# Patient Record
Sex: Male | Born: 1960 | Race: White | Hispanic: No | Smoking: Never smoker
Health system: Southern US, Community
[De-identification: ages and names within clinical notes are randomized; demographics above are authoritative.]

## PROBLEM LIST (undated history)

## (undated) DIAGNOSIS — N200 Calculus of kidney: Secondary | ICD-10-CM

## (undated) DIAGNOSIS — E663 Overweight: Secondary | ICD-10-CM

## (undated) DIAGNOSIS — M5030 Other cervical disc degeneration, unspecified cervical region: Secondary | ICD-10-CM

## (undated) DIAGNOSIS — G4730 Sleep apnea, unspecified: Secondary | ICD-10-CM

## (undated) DIAGNOSIS — E785 Hyperlipidemia, unspecified: Secondary | ICD-10-CM

## (undated) DIAGNOSIS — F419 Anxiety disorder, unspecified: Secondary | ICD-10-CM

## (undated) DIAGNOSIS — I4891 Unspecified atrial fibrillation: Secondary | ICD-10-CM

## (undated) DIAGNOSIS — M5126 Other intervertebral disc displacement, lumbar region: Secondary | ICD-10-CM

## (undated) DIAGNOSIS — J189 Pneumonia, unspecified organism: Secondary | ICD-10-CM

## (undated) DIAGNOSIS — E119 Type 2 diabetes mellitus without complications: Secondary | ICD-10-CM

## (undated) DIAGNOSIS — G4733 Obstructive sleep apnea (adult) (pediatric): Secondary | ICD-10-CM

## (undated) HISTORY — DX: Sleep apnea, unspecified: G47.30

## (undated) HISTORY — DX: Other cervical disc degeneration, unspecified cervical region: M50.30

## (undated) HISTORY — DX: Overweight: E66.3

## (undated) HISTORY — DX: Hyperlipidemia, unspecified: E78.5

## (undated) HISTORY — PX: MANDIBLE FRACTURE SURGERY: SHX706

## (undated) HISTORY — DX: Type 2 diabetes mellitus without complications: E11.9

## (undated) HISTORY — PX: SHOULDER ARTHROSCOPY WITH OPEN ROTATOR CUFF REPAIR: SHX6092

## (undated) HISTORY — PX: SHOULDER SURGERY: SHX246

## (undated) HISTORY — DX: Obstructive sleep apnea (adult) (pediatric): G47.33

## (undated) HISTORY — PX: ANKLE SURGERY: SHX546

## (undated) HISTORY — PX: WRIST SURGERY: SHX841

---

## 2002-01-08 ENCOUNTER — Inpatient Hospital Stay (HOSPITAL_COMMUNITY): Admission: EM | Admit: 2002-01-08 | Discharge: 2002-01-09 | Payer: Self-pay | Admitting: Orthopedic Surgery

## 2002-04-03 ENCOUNTER — Inpatient Hospital Stay (HOSPITAL_COMMUNITY): Admission: RE | Admit: 2002-04-03 | Discharge: 2002-04-04 | Payer: Self-pay | Admitting: Orthopedic Surgery

## 2002-07-17 ENCOUNTER — Encounter: Payer: Self-pay | Admitting: Family Medicine

## 2002-07-17 ENCOUNTER — Encounter: Admission: RE | Admit: 2002-07-17 | Discharge: 2002-07-17 | Payer: Self-pay | Admitting: Family Medicine

## 2002-09-18 ENCOUNTER — Encounter: Payer: Self-pay | Admitting: Emergency Medicine

## 2002-09-18 ENCOUNTER — Emergency Department (HOSPITAL_COMMUNITY): Admission: EM | Admit: 2002-09-18 | Discharge: 2002-09-18 | Payer: Self-pay | Admitting: Emergency Medicine

## 2003-04-12 ENCOUNTER — Inpatient Hospital Stay (HOSPITAL_COMMUNITY): Admission: EM | Admit: 2003-04-12 | Discharge: 2003-04-14 | Payer: Self-pay | Admitting: Emergency Medicine

## 2003-04-12 ENCOUNTER — Encounter: Payer: Self-pay | Admitting: *Deleted

## 2003-04-14 ENCOUNTER — Encounter (INDEPENDENT_AMBULATORY_CARE_PROVIDER_SITE_OTHER): Payer: Self-pay | Admitting: Cardiology

## 2003-10-28 ENCOUNTER — Ambulatory Visit (HOSPITAL_BASED_OUTPATIENT_CLINIC_OR_DEPARTMENT_OTHER): Admission: RE | Admit: 2003-10-28 | Discharge: 2003-10-28 | Payer: Self-pay | Admitting: Orthopedic Surgery

## 2003-10-28 ENCOUNTER — Ambulatory Visit (HOSPITAL_COMMUNITY): Admission: RE | Admit: 2003-10-28 | Discharge: 2003-10-28 | Payer: Self-pay | Admitting: Orthopedic Surgery

## 2005-07-08 ENCOUNTER — Ambulatory Visit (HOSPITAL_BASED_OUTPATIENT_CLINIC_OR_DEPARTMENT_OTHER): Admission: RE | Admit: 2005-07-08 | Discharge: 2005-07-08 | Payer: Self-pay | Admitting: Family Medicine

## 2005-07-17 ENCOUNTER — Ambulatory Visit: Payer: Self-pay | Admitting: Internal Medicine

## 2006-04-20 ENCOUNTER — Encounter: Admission: RE | Admit: 2006-04-20 | Discharge: 2006-04-20 | Payer: Self-pay | Admitting: Family Medicine

## 2010-07-29 ENCOUNTER — Emergency Department (HOSPITAL_COMMUNITY)
Admission: EM | Admit: 2010-07-29 | Discharge: 2010-07-30 | Payer: Self-pay | Source: Home / Self Care | Admitting: Emergency Medicine

## 2010-07-29 ENCOUNTER — Emergency Department (HOSPITAL_COMMUNITY)
Admission: EM | Admit: 2010-07-29 | Discharge: 2010-07-29 | Payer: Self-pay | Source: Home / Self Care | Admitting: Family Medicine

## 2010-10-11 LAB — POCT I-STAT, CHEM 8
BUN: 17 mg/dL (ref 6–23)
Calcium, Ion: 1.09 mmol/L — ABNORMAL LOW (ref 1.12–1.32)
Chloride: 104 mEq/L (ref 96–112)
Creatinine, Ser: 1.1 mg/dL (ref 0.4–1.5)
Glucose, Bld: 100 mg/dL — ABNORMAL HIGH (ref 70–99)
HCT: 49 % (ref 39.0–52.0)
Hemoglobin: 16.7 g/dL (ref 13.0–17.0)
Potassium: 4.4 meq/L (ref 3.5–5.1)
Sodium: 139 meq/L (ref 135–145)
TCO2: 28 mmol/L (ref 0–100)

## 2010-10-11 LAB — PROTIME-INR
INR: 1.24 (ref 0.00–1.49)
Prothrombin Time: 15.8 s — ABNORMAL HIGH (ref 11.6–15.2)

## 2010-10-11 LAB — APTT: aPTT: 34 s (ref 24–37)

## 2010-12-17 NOTE — Discharge Summary (Signed)
NAME:  Justin Kidd, KUZEL NO.:  [id number]   MEDICAL RECORD NO.:  [id number]                   PATIENT TYPE:  INP   LOCATION:  3710                                 FACILITY:  [facility]   PHYSICIAN:  Meade Maw, M.D.                 DATE OF BIRTH:  06-09-1961   DATE OF ADMISSION:  04/12/2003  DATE OF DISCHARGE:  04/14/2003                                 DISCHARGE SUMMARY   ADMISSION DIAGNOSES:  1. New onset atrial fibrillation.  2. Hyperlipidemia.   DISCHARGE DIAGNOSES:  1. New onset atrial fibrillation with controlled ventricular response.  2. Hyperlipidemia on Tricor.  3. Hypokalemia.   PROCEDURE:  None.   DISCHARGE STATUS:  Stable. Improved.   ADMISSION HISTORY:  Please see complete H&P for details, but in short, this  is a 50 year old male who was admitted April 12, 2003 with new onset  atrial fibrillation that began around midnight the night before admission  while he was eating. He had an awareness of forceful palpitations and rapid  heartbeat. Denied any chest pain, dizziness, or near syncope. Did have mild  shortness of breath. The arrhythmia persisted throughout the night and the  next morning. She presented to the [facility] walk-in clinic where EKG confirmed  atrial fibrillation. He was sent to the emergency room for further treatment  and evaluation.   PHYSICAL EXAMINATION:  Please see complete H&P. EKG showed atrial  fibrillation with controlled ventricular response. No ischemic changes.   ADMISSION LABORATORY DATA:  Showed essentially normal CBC and CMP. Cardiac  enzymes were negative. INR was 1.1.   Chest x-ray showed no acute process.   HOSPITAL COURSE:  The patient was admitted for anticoagulation. Thyroid  function and 2-D echocardiogram will be performed. The patient was started  on IV heparin per pharmacy. He was also started on Coumadin load with  initial dose of 10 mg p.o. For the entire admission, the patient had no  complaints of chest discomfort, shortness of breath, weakness, or dizziness.  He was up ad-lib without complaints. On April 13, 2003 early that  morning, he converted to a sinus rhythm. Heparin was changed to Lovenox. His  IV Cardizem was discontinued, and he was started on Toprol. He was up and  ambulating without difficulty.   He remained in sinus rhythm for the rest of his admission. He was discharged  home on April 14, 2003 without further incident. Two-D echocardiogram  was performed just prior to his discharge. Results showed left atrium of 3.7  cm. He had normal LV size and function with an EF of 65%. There were no wall  motion abnormalities. There was no valvular disease.    DISCHARGE MEDICATIONS:  1. Coumadin 5 mg. He was instructed to take 10 mg daily with the exception     of 5 mg on April 18, 2003.  2. Tricor 160 mg daily.  3. Toprol-XL 50 mg daily.  4. K-Dur 20 mEq daily.   ACTIVITY:  As tolerated. He is maintain a low fat, low cholesterol diet as  well as low caffeine intake.   FOLLOW UP:  He is to come to the [facility] Coumadin clinic on Friday, April 18, 2003 at 3:15 p.m. for INR.   He is to have a stress test at [facility] cardiology Wednesday, April 23, 2003 at 11:30 a.m. He has a followup appointment with Dr. Fraser Din on Monday,  May 05, 2003 at 12:30 p.m. He is to call sooner if problems.      Adrian Saran, N.P.                        Meade Maw, M.D.    HB/MEDQ  D:  05/07/2003  T:  05/08/2003  Job:  [id number]   cc:   Donia Guiles, M.D.  [address] [place]  [place] [address]  Fax: [phone]

## 2010-12-17 NOTE — Op Note (Signed)
[facility]  Patient:    Justin Kidd, Justin Kidd Visit Number: [id number] MRN: [id number]          Service Type: SUR Location: 4W [id number] [id number] Attending Physician:  Marlowe Kays Page Dictated by:   Illene Labrador. Aplington, M.D. Proc. Date: 01/07/02 Admit Date:  01/07/2002 Discharge Date: 01/09/2002                             Operative Report  PREOPERATIVE DIAGNOSIS:  Torn rotator cuff, left shoulder.  POSTOPERATIVE DIAGNOSIS:  Torn rotator cuff, left shoulder.  OPERATION: 1. Anterior acromionectomy. 2. Repair of torn rotator cuff, left shoulder.  SURGEON:  Illene Labrador. Aplington, M.D.  ASSISTANTDruscilla Brownie. Underwood III, P.A.-C.  ANESTHESIA:  General.  PATHOLOGY AND JUSTIFICATION OF PROCEDURE:  He actually has bilateral shoulder pain.  MRI has demonstrated bilateral rotator cuff tears.  The left is more symptomatic than the right and is being repaired at this time.  As depicted in the MRI, he had about a 2 cm with tear at the greater tuberosity with about 1 cm of retraction.  He had a significant impingement problem.  DESCRIPTION OF PROCEDURE:  Prophylactic antibiotics, in the beach chair position position on the Schlein frame.  The left shoulder girdle was prepped with DuraPrep and draped in a sterile field, Ioban employed.  Vertical incision from roughly the [facility] joint down to the greater tuberosity.  The fascia over the anterior acromion was opened in line with the skin incision and the deltoid split minimally.  I had a difficult time getting a Cobb elevator beneath the anterior acromion which was quite thick.  After identifying the [facility] joint with a Keith needle, I marked out my tentative line for anterior acromionectomy with a Bovie and made my initial anterior acromionectomy with a microsaw followed by additional anterior acromionectomy inferiorly until the impingement problem was corrected.  He had a good bit of bursal tissue which I also excised.  The  tear described above was noted.  It was actually a fairly small aperture with a very thin margin of repair of tissue over top, and I opened it up to a full extent of the tear anteriorly and posteriorly. I identified the retraction of the underneath fibers.  I then made a bony trough in the greater tuberosity with quarter inch osteotome and small rongeur and made two lateral drill holes through the greater tuberosity into this bony trough.  I then placed a #1 Ethibond suture with a horizontal mattress suture through the mid portion of the rotator cuff, capturing both the retracted and the superficial layers of rotator cuff and then made two additional horizontal mattress sutures using these holes, one anteriorly and one posteriorly with one loop through the rotator cuff tendon itself.  With the arm abducted, I then tied the three sutures securely and supplemented this with multiple interrupted #1 Ethibond suture through the end of the rotator cuff into rotator cuff tissue which was residual at the greater tuberosity.  This gave a nice, stable repair.  We then checked for residual impingement, and there was none.  The wound was irrigated with sterile saline and soft tissue was infiltrated with 0.5% Marcaine with adrenalin.  He was given 30 mg of Toradol IV.  The deltoid muscle was reapproximated in two layers with interrupted #1 Vicryl and the fascia over the anterior acromion in one layer with interrupted #1 Vicryl to get a nice secure  stable repair.  Subcutaneous tissue was closed with a combination of 2-0 Vicryl deep, 4-0 Vicryl superficially, with Steri-Strips on the skin.  Dry sterile dressing, shoulder immobilizer applied. Tolerated the procedure well and was taken to the recovery room in satisfactory condition with no known complications. Dictated by:   Illene Labrador. Aplington, M.D. Attending Physician:  Joaquin Courts DD:  01/07/02 TD:  01/08/02 Job: [id number]

## 2010-12-17 NOTE — Op Note (Signed)
NAME:  FADIL, MACMASTER                            ACCOUNT NO.:  [id number]   MEDICAL RECORD NO.:  [id number]                   PATIENT TYPE:  AMB   LOCATION:  DSC                                  FACILITY:  [facility]   PHYSICIAN:  Leonides Grills, M.D.                  DATE OF BIRTH:  [birth year]   DATE OF PROCEDURE:  10/28/2003  DATE OF DISCHARGE:                                 OPERATIVE REPORT   PREOPERATIVE DIAGNOSES:  1. Right anterior medial ankle impingement.  2. 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   POSTOPERATIVE DIAGNOSES:  1. Right anterior medial ankle impingement.  2. 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   OPERATION:  1. Right ankle arthroscopy with extensive debridement.  2. Posterior lateral ankle arthrotomy with tenosynovectomy.  3. Partial excision, right talus, i.e., os trigonum excision.  4. Sural nerve neurolysis.   ANESTHESIA:  General endotracheal tube.   SURGEON:  Leonides Grills, M.D.   ASSISTANT:  Lianne Cure, P.A.   ESTIMATED BLOOD LOSS:  Minimal.   TOURNIQUET TIME:  Approximately 1 hour 15 minutes.   COMPLICATIONS:  None.   DISPOSITION:  Stable to the PAR.   INDICATIONS:  This is a 50 year old male who has had persistent ankle pain  that is interfering with his life to the point that he cannot do what he  wants to do.  He was consented for the above procedure.  All risks, which  include infection, nerve or vessel injury, persistence of pain, worsening  pain, stiffness, arthritis, mechanical symptoms, were all explained and  questions encouraged and answered.   OPERATION:  The patient was brought to the operating room and placed in the  supine position after adequate general endotracheal tube anesthesia was  administered as well as Ancef 1 g IV piggyback.  The right lower extremity  was then prepped and draped over a proximally-placed thigh tourniquet after  the patient was placed in a slight sloppy  lateral position with the  operative side up.  A longitudinal incision on the posterolateral aspect of  his ankle was then made.  Dissection was carried down through skin, sural  nerve was identified and neurolysed.  Once the sural nerve neurolysis was  performed, then it was protected throughout the remaining portion of the  case.  Once the sural nerve neurolysis was performed, a posterior ankle  arthrotomy was then made.  There was a large amount of synovitis in this  area, and this was meticulously debrided with a rongeur and a synovectomy  rongeur.  The os trigonum was then palpated.  Then with a curved quarter-  inch osteotome this was removed with a mallet as well.  This was then  rounded off with the rongeur.  This was palpated over medially to the [facility]  tendon as well and visualized.  The ankle was ranged.  In forced plantar  flexion there was no impingement posteriorly.  The area was copiously  irrigated with normal saline.  The subcu was closed with 3-0 Vicryl, the  skin was closed with 4-0 nylon stitch.  Prior to this the limb was gravity-  exsanguinated and the tourniquet was elevated as well.  We then mapped out  the anterior ankle structures, the anterior tibialis and peroneus tertius as  well as the superficial peroneal nerve.  We then placed 20 mL normal saline  to the ankle through anteromedial portal.  The anteromedial portal was  created with __________ spread technique.  A blunt trocar for operative  cannula was placed as well as the camera.  Under direct visualization the  anterior lateral portal was then created with __________ spread technique,  utilizing a spinal needle as well.  There was a large amount of synovitis in  the anterior aspect of his ankle, and there was a tremendously large soft  tissue flap in the anterior medial aspect of the ankle as well.  This was  all debrided extensively with a radiofrequency bevel as well as a shaver.  Once this was extensively  debrided, there was no obvious osteochondral  lesion.  The medial and lateral gutter were also debrided as well.  The  accessory tibial ligament was also hypertrophic and was excised as well.  We  ranged the ankle, and there were no impinging areas, no flaps as well.  The  scope was removed and wounds were closed with 4-0 nylon sutures, sterile  dressing was applied, a Cam walker boot was applied.  The patient was stable  to PAR.                                               Leonides Grills, M.D.    PB/MEDQ  D:  10/28/2003  T:  10/28/2003  Job:  [id number]

## 2010-12-17 NOTE — H&P (Signed)
NAME:  Justin Kidd, Justin Kidd NO.:  [id number]   MEDICAL RECORD NO.:  [id number]                   PATIENT TYPE:  INP   LOCATION:  1825                                 FACILITY:  [facility]   PHYSICIAN:  Cassell Clement, M.D.              DATE OF BIRTH:  [birth year]   DATE OF ADMISSION:  04/12/2003  DATE OF DISCHARGE:                                HISTORY & PHYSICAL   CHIEF COMPLAINT:  Arrhythmia.   HISTORY:  This is a 50 year old, married, Caucasian gentleman who was  admitted with new onset of atrial fibrillation.  The fibrillation began at  midnight last night while he was eating a snack.  He noted sudden onset of  forceful palpitations in his chest and it felt rapid at that time.  However,  he did not experience any chest discomfort.  He had only slight dyspnea.  He  went up and mentioned the arrhythmia to his wife, who is a Engineer, civil (consulting), and she  recommended that he try resting.  He did rest and the arrhythmia persisted  during the night.  This morning he sent to the [facility] where  his electrocardiogram confirmed the presence of atrial fibrillation with a  controlled ventricular response.  There were no ischemic changes.  Because  of the new of atrial fibrillation, the patient was advised to come to the  emergency room for further evaluation.  The patient states that  approximately two years ago he saw Meade Maw, M.D., at [facility]  for workup for arrhythmia.  He recalls having an echocardiogram which he  thinks was normal.  He also had an event monitor for several weeks.  He  recalls being placed on medication, which he thinks was a beta blocker, for  several months.  He also recalls that he was told that he was not having  atrial fibrillation and he did not have to go on Coumadin at that time  therefore.  After several months, he stopped taking the medication and had  no further trouble.  The patient is on no regular medicine, except  for  TriCor for high cholesterol.   FAMILY HISTORY:  The patient's mother and father are alive and well.  The  maternal grandparents have a history of high blood pressure, stroke, and  heart attack.   SOCIAL HISTORY:  He is married.  He has two children, age 23 and age 65  months.  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  The patient does not  use tobacco or alcohol.  He drinks about one-and-a-half glasses of ice tea a  day.  He avoids caffeinated sodas.  He does not drink coffee.  He does not  get any regular exercise.   ALLERGIES:  None.   PAST SURGICAL HISTORY:  He has had surgery on both shoulders for rotator  cuff surgery, which was successful.  His orthopedist  was Dr. Simonne Come.   REVIEW OF SYSTEMS:  GASTROINTESTINAL:  He has had frequent upset stomach  recently and has an appointment to see a gastroenterologist in [place],  [place], soon.  GENITOURINARY:  No symptoms of dysuria.  He has had  kidney stones twice in the past.  He denies any recent cough or sputum  production, but has had pneumonia in the past.  He denies any history of  exertional chest pain or exertional dyspnea.   PHYSICAL EXAMINATION:  VITAL SIGNS:  Blood pressure 113/71, pulse 70 in  atrial fibrillation.  GENERAL APPEARANCE:  A somewhat overweight young man in no acute distress.  SKIN:  Clear.  HEENT:  Pupils are equal and reactive.  The sclerae are clear.  Extraocular  movements are full.  There is no evidence of exophthalmos or proptosis.  NECK:  The carotids are normal.  Jugular venous pressure normal.  Thyroid  normal.  No lymphadenopathy.  LUNGS:  Clear to auscultation and percussion.  HEART:  No murmur, gallop, or click.  ABDOMEN:  Obese, nontender.  No mass.  EXTREMITIES:  Good peripheral pulses.  No edema.  No phlebitis.  NEUROLOGIC:  Physiologic.   LABORATORY DATA:  His electrocardiogram shows atrial fibrillation with a  controlled ventricular response.  There are no ischemic  changes.  Laboratory  studies are pending.  The chest x-ray is not yet done.   IMPRESSION:  1. New onset of atrial fibrillation of undetermined etiology.  The atrial     fibrillation is symptomatic, causing palpitations.  2. History of  hyperlipidemia.   DISPOSITION:  We are admitting the patient to telemetry on Dr. Lindell Spar  service.  Will start him on low-dose IV Cardizem.  His rate is not  excessively fast at the present time.  Will also begin anticoagulation with  heparin and Coumadin.  Will evaluate thyroid function and other correctable  causes of atrial fibrillation.  Will plan to get a 2-D echocardiogram on  Monday when the lab reopens.  Further studies as per Meade Maw, M.D.                                                Cassell Clement, M.D.    TB/MEDQ  D:  04/12/2003  T:  04/12/2003  Job:  [id number]   cc:   Donia Guiles, M.D.  [address] [place]  [place] [address]  Fax: [phone]

## 2010-12-17 NOTE — Op Note (Signed)
Justin Kidd, [name]                           ACCOUNT NO.:  [id number]   MEDICAL RECORD NO.:  [id number]                   PATIENT TYPE:  AMB   LOCATION:  DFTL                                 FACILITY:  [facility]   PHYSICIAN:  James P. Aplington, M.D.            DATE OF BIRTH:  09-Oct-1960   DATE OF PROCEDURE:  04/02/2002  DATE OF DISCHARGE:                                 OPERATIVE REPORT   PREOPERATIVE DIAGNOSIS:  Torn rotator cuff, right shoulder.   POSTOPERATIVE DIAGNOSIS:  Torn rotator cuff, right shoulder.   OPERATION:  Anterior acromionectomy and repair of torn rotator cuff, right  shoulder.   SURGEON:  Illene Labrador. Aplington, M.D.   ASSISTANT:  Clarene Reamer, P.A.-C.   ANESTHESIA:  General.   PATHOLOGY AND JUSTIFICATION FOR PROCEDURE:  He has had successful repair of  a full-thickness rotator cuff tear on the left. He is here today for repair  of what was described on MRI of January 14, 2002 as a small full-thickness  somewhat retracted tear on the right. At surgery, he did have a significant  impingement problem with a very thick subdeltoid bursa. The tear was  described on the MRI at the level of the greater tuberosity.   DESCRIPTION OF PROCEDURE:  Prophylactic antibiotics, satisfactory general  anesthesia. Beach chair position on the Schlein frame, the right shoulder  girdle was prepped with Duraprep and draped in a sterile field, Ioban  employed. A vertical incision from the [facility] joint down to the greater  tuberosity. The fascia over the anterior acromion was opened in line with a  skin incision with cutting cautery and reflected back over the anterior  acromion back to the [facility] joint which I identified with the [facility] needle. I  then marked out my initial line of anterior acromionectomy with Bovie and  protected the underlying rotator cuff structures, made my initial anterior  acromionectomy removal. There I then also removed a large amount of  additional bone from the  subacromial area since he had a significant  impingement problem. I also had to remove a good bit of subdeltoid bursa.  The tear was identified as described above. I opened it with a 15 knife  blade and was able to retrieve the retracted underneath surface fibers of  the rotator cuff and restored the full-thickness rotator cuff to its normal  state with a single horizontal suture of #1 Ethibond. I then felt that the  repair would do well with a super Mitek anchor which I placed lateral to the  greater tuberosity and I then placed two arms up beneath the rotator cuff  both anteriorly and posteriorly and with the arm abducted and using tension  on the original repair suture, I was able to reattach the full-thickness  rotator cuff nicely at the level of the greater tuberosity. I then  supplemented this with multiple Ethibond suture from the Mitek anchor  sutures. This gave a nice stable repair with his arm to his side. I then  irrigated the wound well sterile saline and infiltrated all soft tissue with  0.5% Marcaine with adrenaline. The fascia over the anterior acromion and  distal clavicle was then reapproximated with interrupted #1 Vicryl and the  deltoid muscle in two layers with the  same. This gave a nice stable repair. The subcutaneous tissue was closed  with 2-0 Vicryl and skin with staples. Betadine Adaptic dry sterile dressing  and shoulder immobilizer were applied. He tolerated the procedure well and  was taken to the recovery room in satisfactory condition with no known  complications.                                               James P. Aplington, M.D.    JPA/MEDQ  D:  04/02/2002  T:  04/02/2002  Job:  [id number]

## 2010-12-17 NOTE — Procedures (Signed)
NAME:  Justin Kidd, Justin Kidd                  ACCOUNT NO.:  [id number]   MEDICAL RECORD NO.:  [id number]          PATIENT TYPE:  OUT   LOCATION:  SLEEP CENTER                 [facility]ACILITY[facility]  [facility]   PHYSICIAN:  Clinton D. Maple Hudson, M.D. DATE OF BIRTH:  1961/02/24   DATE OF STUDY:  07/08/2005                              NOCTURNAL POLYSOMNOGRAM   REFERRING PHYSICIAN:  Dr. Benita Stabile.   DATE OF STUDY:  July 08, 2005.   INDICATION FOR STUDY:  Hypersomnia with sleep apnea.   EPWORTH SLEEPINESS SCORE:  6/24.   BMI:  32.   WEIGHT:  261 pounds.   SLEEP ARCHITECTURE:  Total sleep time 399 minutes with sleep efficiency 94%.  Stage I was 7%, stage II 76%, stages III and IV absent, REM 17% of total  sleep time. Sleep latency 1 minute, REM latency 66 minutes, awake after  sleep onset 22 minutes, arousal index increased at 40 per hour. Ambien was  taken at 2130 p.m.   RESPIRATORY DATA:  Apnea/hypopnea index (AHI, RDI) 15 obstructive events per  hour indicating mild to moderate obstructive sleep apnea/hypopnea syndrome.  Most sleep and therefore most events was while supine. There were 58  obstructive apneas and 42 hypopneas. REM AHI 72.7 per hour. Most events  clustered late in the night and there were insufficient early events to  permit C-PAP titration by protocol on the study night.   OXYGEN DATA:  Moderate snoring with oxygen desaturation to a nadir of 78%.  Mean oxygen saturation through the study was 96% on room air.   CARDIAC DATA:  Normal sinus rhythm.   MOVEMENT/PARASOMNIA:  A total of 349 limb jerks were reported of which 84  were associated with arousal or awakening for periodic limb movement with  arousal index of 12.6 per hour which is increased.   IMPRESSION/RECOMMENDATIONS:  1.  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 Events were markedly more common during REM.      Snoring was moderate with oxygen desaturation  to 78%.  2.  Consider return for C-PAP titration or evaluate for alternative      therapies as appropriate.  3.  Periodic limb movement with arousal, 12.6 per hour. This may deserve      specific therapy such as Requip or Mirapex if it does not improve with      stabilization of the sleep apnea related arousals.      Clinton D. Maple Hudson, M.D.  Diplomate, Biomedical engineer of Sleep Medicine  Electronically Signed     CDY/MEDQ  D:  07/17/2005 14:27:51  T:  07/17/2005 23:30:07  Job:  [id number]

## 2010-12-17 NOTE — H&P (Signed)
NAME:  Justin Kidd, CYBULSKI                            ACCOUNT NO.:  [id number]   MEDICAL RECORD NO.:  [id number]                   PATIENT TYPE:  AMB   LOCATION:  D[facility]                                 FACILITY:  [facility]   PHYSICIAN:  James P. Aplington, M.D.            DATE OF BIRTH:  18-May-1961   DATE OF ADMISSION:  04/02/2002  DATE OF DISCHARGE:                                HISTORY & PHYSICAL   CHIEF COMPLAINT:  Pain in my right shoulder.   HISTORY OF PRESENT ILLNESS:  This 51 year old white male who has previously  successfully undergone repair of the rotator cuff on the left shoulder with  acromionectomy now has presented with painful right shoulder.  The patient  has documented tear of rotator cuff of the right shoulder on previous  examinations.  He continues with positive impingement signs as well as pain  on external rotation.  This pain has been going on for some time, at least  15 years on both shoulders.  Initially, it was attributed to rigorous work-  out schedule.  He is very pleased with the left shoulder and is anxious to  have similar results with open repair of the rotator cuff of the right  shoulder and acromionectomy.   PAST MEDICAL HISTORY:  Unchanged from his last admission of January 07, 2002.  He had a history of renal calculi in the past.  Has mild depression for  which he takes Prozac 20 mg one q.d.  He has had no recent episodes of  pneumonia or bronchitis which he has had in the past.   PAST SURGICAL HISTORY:  He had a mandibular osteotomy in 1981 for overbite,  left wrist surgery in 1983, and left shoulder surgery as mentioned above.   SOCIAL HISTORY:  The patient neither smokes nor drinks.  Is married.   FAMILY HISTORY:  Noncontributory.   REVIEW OF SYMPTOMS:  CNS:  No seizures, __________ paralysis, numbness, or  double vision.  RESPIRATORY:  No productive cough.  No hemoptysis.  No  shortness of breath.  CARDIOVASCULAR:  No chest pain.  No angina.  No  orthopnea.  GASTROINTESTINAL:  No nausea, vomiting, melena, or bloody  stools.  GENITOURINARY:  The patient has no discharge, dysuria, or hematuria  but he does have difficulty in beginning voiding after general anesthesia.  He had to be catheterized several times after his last surgery on his  shoulder.   PHYSICAL EXAMINATION:  GENERAL:  Alert and cooperative, friendly 50 year old  white male.  VITAL SIGNS:  Blood pressure 120/86, pulse 72, respirations 12.  HEENT:  Normocephalic.  PERRLA.  EOM intact.  Oropharynx is clear.  CHEST:  Clear to auscultation.  No rhonchi.  No rales.  HEART:  Regular rate and rhythm.  No murmurs heard.  ABDOMEN:  Soft, nontender.  Liver and spleen not felt.  GENITALIA:  Not done.  Not pertinent to present illness.  RECTAL:  Not done.  Not pertinent to present illness.  EXTREMITIES:  Right shoulder as in present illness above.  Left shoulder  shows a well healed surgical scar.   ADMITTING DIAGNOSES:  Tear of the rotator cuff of the right shoulder with  impingement.   PLAN:  The patient will undergo anterior acromionectomy and repair of the  rotator cuff of the right shoulder.  I have written on an order sheet that  the patient has taken with him to begin the patient on Flomax 0.4 mg one  b.i.d. as soon as he can take p.o. medications.  Also, he had a considerable  amount of nausea postoperatively and I have gone ahead and preordered Zofran  4 mg q.6h. p.r.n. severe nausea.  He takes his order sheet and other  paperwork with him.     Dooley L. Shela Nevin, P.A.             James P. Aplington, M.D.    DLU/MEDQ  D:  03/26/2002  T:  03/26/2002  Job:  [id number]   cc:   Fayrene Fearing P. Aplington, M.D.

## 2010-12-17 NOTE — H&P (Signed)
[facility]  Patient:    VINCE, AINSLEY Visit Number: [id number] MRN: [id number]          Service Type: SUR Location: 4W [id number] 01 Attending Physician:  Marlowe Kays Page Dictated by:   Della Goo, P.A. Admit Date:  01/07/2002                           History and Physical  HISTORY OF PRESENT ILLNESS:  The patient is a 50 year old white male with a several-year history of bilateral shoulder pain.  Recently the pain of his left shoulder has worsened significantly.  He is having constant pain and unable to find any relief.  He has used anti-inflammatories without relief of his symptoms.  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  He played football at [employer] while in college for four years.  X-rays of the left shoulder have revealed a type 1 acromion with a large subacromial spur posterior.  He was sent for an MRI of the left shoulder which did reveal a retracted rotator cuff tear of the left shoulder with downsloping acromion.  It was felt he would benefit from surgical intervention and is being admitted now for a rotator cuff repair of the left shoulder.  It is of note that the patient is left-hand dominant.  CURRENT MEDICATIONS:  Prozac 20 mg q.d.  ALLERGIES:  No known drug allergies.  PAST MEDICAL HISTORY: 1. Kidney stones. 2. Mild depression. 3. Pneumonia and bronchitis in the distant past, without complications.  PAST SURGICAL HISTORY: 1. Mandibular osteotomy in 1981, for correction of an overbite. 2. Left wrist surgery in 1983.  He is unsure of the exact procedure performed    on the left wrist.  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  He has no intake of tobacco or alcohol.  He has one child and lives with his wife in a townhome.  His wife is a neurology ICU nurse at [employer].  SOCIAL HISTORY:  The patients maternal grandmother and  grandfather both had heart disease.  The patients paternal grandmother had cancer; however, he is unsure of the type.  His maternal grandfather also had a stroke.  PRIMARY CARE PHYSICIAN:  Dr. Donia Guiles.  REVIEW OF SYSTEMS:  CNS:  The patient denies blurred vision, double vision, seizure disorder, headaches, or paralysis.  CARDIORESPIRATORY:  No chest pain, shortness of breath, cough, sputum production, or hemoptysis. GASTROINTESTINAL/GENITOURINARY:  No nausea, vomiting, diarrhea, or constipation.  No dysuria, hematuria, melena, or bloody stools. MUSCULOSKELETAL:  As per history of present illness.  The patient also reports multiple broken bones including his cheek bone, nose, small toes of both feet, both wrists, and right thumb.  All of these were treated nonsurgically. HEMATOLOGIC:  The patient denies history of anemia, bleeding disorder, blood clots.  PHYSICAL EXAMINATION:  VITAL SIGNS:  Pulse 64 and regular, blood pressure 110/78.  GENERAL:  Well-developed, well-nourished white male.  Alert and oriented x4. In no acute distress.  Very pleasant and cooperative.  HEENT:  Normocephalic, atraumatic.  Pupils are equal, round, and reactive to light and accommodation.  Extraocular movements intact.  Nose without drainage.  Oropharynx without edema or erythema.  NECK:  Supple.  No adenopathy or thyromegaly.  LUNGS:  Clear to auscultation.  HEART:  Regular rate and rhythm.  No murmur heard.  ABDOMEN:  Soft and nontender.  Bowel sounds present.  GENITOURINARY, RECTAL, BREASTS:  Not performed.  Not pertinent to present illness.  EXTREMITIES:  The patient has decreased range of motion bilateral shoulders. He has unrestricted range of motion of the cervical spine.  There is some tenderness over the anterior rotator cuff of both left and right shoulder. Distal pulses and sensation are intact in both upper extremities.  In the lower extremities neurovascular and motor function  is intact.  IMPRESSION:  Rotator cuff tear left shoulder.  PLAN:  The patient is being admitted for an open rotator cuff repair of the left shoulder. Dictated by:   Della Goo, P.A. Attending Physician:  Joaquin Courts DD:  01/03/02 TD:  01/04/02 Job: [id number]

## 2012-04-10 ENCOUNTER — Encounter (HOSPITAL_COMMUNITY): Payer: Self-pay | Admitting: *Deleted

## 2012-04-10 ENCOUNTER — Inpatient Hospital Stay (HOSPITAL_COMMUNITY)
Admission: EM | Admit: 2012-04-10 | Discharge: 2012-04-13 | DRG: 293 | Disposition: A | Discharge status: Home / Self Care | Payer: 59 | Attending: Internal Medicine | Admitting: Internal Medicine

## 2012-04-10 ENCOUNTER — Emergency Department (HOSPITAL_COMMUNITY): Payer: Self-pay

## 2012-04-10 DIAGNOSIS — I509 Heart failure, unspecified: Principal | ICD-10-CM | POA: Diagnosis present

## 2012-04-10 DIAGNOSIS — I4891 Unspecified atrial fibrillation: Secondary | ICD-10-CM | POA: Diagnosis present

## 2012-04-10 DIAGNOSIS — Z79899 Other long term (current) drug therapy: Secondary | ICD-10-CM

## 2012-04-10 DIAGNOSIS — Z823 Family history of stroke: Secondary | ICD-10-CM

## 2012-04-10 DIAGNOSIS — G8929 Other chronic pain: Secondary | ICD-10-CM | POA: Diagnosis present

## 2012-04-10 DIAGNOSIS — I498 Other specified cardiac arrhythmias: Secondary | ICD-10-CM | POA: Diagnosis present

## 2012-04-10 HISTORY — DX: Pneumonia, unspecified organism: J18.9

## 2012-04-10 HISTORY — DX: Unspecified atrial fibrillation: I48.91

## 2012-04-10 HISTORY — DX: Anxiety disorder, unspecified: F41.9

## 2012-04-10 LAB — CBC WITH DIFFERENTIAL/PLATELET
Basophils Relative: 0 % (ref 0–1)
Hemoglobin: 13.8 g/dL (ref 13.0–17.0)
Lymphocytes Relative: 37 % (ref 12–46)
Lymphs Abs: 3.2 10*3/uL (ref 0.7–4.0)
MCHC: 34.1 g/dL (ref 30.0–36.0)
Monocytes Relative: 8 % (ref 3–12)
Neutro Abs: 4.6 10*3/uL (ref 1.7–7.7)
Neutrophils Relative %: 53 % (ref 43–77)
RBC: 4.61 MIL/uL (ref 4.22–5.81)
WBC: 8.6 10*3/uL (ref 4.0–10.5)

## 2012-04-10 LAB — POCT I-STAT TROPONIN I: Troponin i, poc: 0 ng/mL (ref 0.00–0.08)

## 2012-04-10 NOTE — ED Provider Notes (Signed)
History     CSN: [id number]  Arrival date & time 04/10/12  2314   First MD Initiated Contact with Patient 04/10/12 2345      Chief Complaint  Patient presents with  . Shortness of Breath  . Nausea  . Choking  . Palpitations     HPI C/o sob, anxious, choking feeling, pedal edema, palpitations, "can't lay back". Alert, NAD, anxious, interactive. VSS. afib now, "h/o afib, usually NSR". Takes methadone, "getting off of roxi".  Past Medical History  Diagnosis Date  . Atrial fibrillation   . Pneumonia   . Anxiety     Past Surgical History  Procedure Date  . Shoulder surgery   . Ankle surgery   . Wrist surgery     Family History  Problem Relation Age of Onset  . Heart failure Mother   . Stroke Other     History  Substance Use Topics  . Smoking status: Never Smoker   . Smokeless tobacco: Never Used  . Alcohol Use: No      Review of Systems  All other systems reviewed and are negative.    Allergies  Review of patient's allergies indicates no known allergies.  Home Medications   No current outpatient prescriptions on file.  BP 118/77  Pulse 69  Temp 98.2 F (36.8 C) (Oral)  Resp 18  Ht 6\' 3"  (1.905 m)  Wt 299 lb 9.7 oz (135.9 kg)  BMI 37.45 kg/m2  SpO2 97%  Physical Exam  Nursing note and vitals reviewed. Constitutional: He is oriented to person, place, and time. He appears well-developed. No distress.  HENT:  Head: Normocephalic and atraumatic.  Eyes: Pupils are equal, round, and reactive to light.  Neck: Normal range of motion.  Cardiovascular: Normal rate and intact distal pulses.        Atrial fibrillation with slow ventricular response Abnormal ECG Rate = 59  Pulmonary/Chest: No respiratory distress. He has rales.  Abdominal: Normal appearance. He exhibits no distension.  Musculoskeletal: Normal range of motion. He exhibits edema.  Neurological: He is alert and oriented to person, place, and time. No cranial nerve deficit.  Skin: Skin  is warm and dry. No rash noted.  Psychiatric: He has a normal mood and affect. His behavior is normal.    ED Course  Procedures (including critical care time  PRN Meds:.  Labs Reviewed  COMPREHENSIVE METABOLIC PANEL - Abnormal; Notable for the following:    Glucose, Bld 113 (*)     Albumin 3.4 (*)     AST 39 (*)     All other components within normal limits  PRO B NATRIURETIC PEPTIDE - Abnormal; Notable for the following:    Pro B Natriuretic peptide (BNP) 274.4 (*)     All other components within normal limits  PROTIME-INR - Abnormal; Notable for the following:    Prothrombin Time 32.0 (*)     INR 3.05 (*)     All other components within normal limits  CK TOTAL AND CKMB - Abnormal; Notable for the following:    Total CK 1729 (*)     All other components within normal limits  BASIC METABOLIC PANEL - Abnormal; Notable for the following:    Glucose, Bld 123 (*)     All other components within normal limits  PROTIME-INR - Abnormal; Notable for the following:    Prothrombin Time 29.1 (*)     INR 2.70 (*)     All other components within normal limits  PRO B  NATRIURETIC PEPTIDE - Abnormal; Notable for the following:    Pro B Natriuretic peptide (BNP) 652.1 (*)     All other components within normal limits  CBC WITH DIFFERENTIAL  POCT I-STAT TROPONIN I  TROPONIN I  CBC  TROPONIN I  TROPONIN I  TSH  T4, FREE  PROTIME-INR  BASIC METABOLIC PANEL   Dg Chest Portable 1 View  04/10/2012  *RADIOLOGY REPORT*  Clinical Data: Choking sensation and shortness of breath.  PORTABLE CHEST - 1 VIEW  Comparison: 07/29/2010 and chest CTA dated 07/30/2000.  Findings: Interval mild enlargement of the cardiac silhouette with increased prominence of the pulmonary vasculature and interstitial markings.  No pleural fluid.  Unremarkable bones.  IMPRESSION: Interval cardiomegaly and changes of congestive heart failure.   Original Report Authenticated By: Darrol Angel, M.D.      1. CHF (congestive  heart failure)   2. Atrial fibrillation       MDM         Nelia Shi, MD 04/12/12 [id number]

## 2012-04-10 NOTE — ED Notes (Addendum)
C/o sob, anxious, choking feeling, pedal edema, palpitations, "can't lay back". Alert, NAD, anxious, interactive. VSS. afib now, "h/o afib, usually NSR". Takes methadone, "getting off of roxi".

## 2012-04-10 NOTE — ED Notes (Signed)
Significant +4 pedal edema noted. LS CTA. Throat unremarkable.

## 2012-04-11 ENCOUNTER — Encounter (HOSPITAL_COMMUNITY): Payer: Self-pay | Admitting: Nurse Practitioner

## 2012-04-11 DIAGNOSIS — I4891 Unspecified atrial fibrillation: Secondary | ICD-10-CM

## 2012-04-11 DIAGNOSIS — I509 Heart failure, unspecified: Principal | ICD-10-CM

## 2012-04-11 LAB — PRO B NATRIURETIC PEPTIDE: Pro B Natriuretic peptide (BNP): 652.1 pg/mL — ABNORMAL HIGH (ref 0–125)

## 2012-04-11 LAB — CBC
Hemoglobin: 14.7 g/dL (ref 13.0–17.0)
MCH: 29.7 pg (ref 26.0–34.0)
MCHC: 33.6 g/dL (ref 30.0–36.0)
Platelets: 227 10*3/uL (ref 150–400)

## 2012-04-11 LAB — TROPONIN I: Troponin I: <0.3 ng/mL (ref <0.30)

## 2012-04-11 LAB — COMPREHENSIVE METABOLIC PANEL
BUN: 16 mg/dL (ref 6–23)
CO2: 29 mEq/L (ref 19–32)
Calcium: 9.2 mg/dL (ref 8.4–10.5)
Creatinine, Ser: 0.8 mg/dL (ref 0.50–1.35)
GFR calc Af Amer: >90 mL/min (ref >90)
GFR calc non Af Amer: >90 mL/min (ref >90)
Glucose, Bld: 113 mg/dL — ABNORMAL HIGH (ref 70–99)

## 2012-04-11 LAB — PROTIME-INR
INR: 2.7 — ABNORMAL HIGH (ref 0.00–1.49)
Prothrombin Time: 29.1 seconds — ABNORMAL HIGH (ref 11.6–15.2)

## 2012-04-11 LAB — CK TOTAL AND CKMB (NOT AT ARMC)
CK, MB: 3.7 ng/mL (ref 0.3–4.0)
Total CK: 1729 U/L — ABNORMAL HIGH (ref 7–232)

## 2012-04-11 LAB — BASIC METABOLIC PANEL
BUN: 14 mg/dL (ref 6–23)
Calcium: 9.7 mg/dL (ref 8.4–10.5)
GFR calc Af Amer: >90 mL/min (ref >90)
GFR calc non Af Amer: >90 mL/min (ref >90)
Potassium: 4.4 mEq/L (ref 3.5–5.1)
Sodium: 138 mEq/L (ref 135–145)

## 2012-04-11 MED ORDER — METHADONE HCL 10 MG PO TABS: 50.0000 mg | ORAL_TABLET | Freq: Every day | ORAL | Status: DC

## 2012-04-11 MED ORDER — DILTIAZEM HCL ER 240 MG PO CP24
240.0000 mg | ORAL_CAPSULE | Freq: Every day | ORAL | Status: DC
  Administered 2012-04-11: 240 mg via ORAL
  Filled 2012-04-11 (×2): qty 1

## 2012-04-11 MED ORDER — WARFARIN SODIUM 7.5 MG PO TABS
7.5000 mg | ORAL_TABLET | Freq: Every day | ORAL | Status: DC
  Filled 2012-04-11: qty 1

## 2012-04-11 MED ORDER — ACETAMINOPHEN 650 MG RE SUPP: 650.0000 mg | Freq: Four times a day (QID) | RECTAL | Status: DC | PRN

## 2012-04-11 MED ORDER — PNEUMOCOCCAL VAC POLYVALENT 25 MCG/0.5ML IJ INJ
0.5000 mL | INJECTION | INTRAMUSCULAR | Status: AC
  Administered 2012-04-12: 0.5 mL via INTRAMUSCULAR
  Filled 2012-04-11: qty 0.5

## 2012-04-11 MED ORDER — ALUM & MAG HYDROXIDE-SIMETH 200-200-20 MG/5ML PO SUSP: 30.0000 mL | Freq: Four times a day (QID) | ORAL | Status: DC | PRN

## 2012-04-11 MED ORDER — SODIUM CHLORIDE 0.9 % IJ SOLN
3.0000 mL | Freq: Two times a day (BID) | INTRAMUSCULAR | Status: DC
  Administered 2012-04-11 – 2012-04-12 (×3): 3 mL via INTRAVENOUS

## 2012-04-11 MED ORDER — FUROSEMIDE 10 MG/ML IJ SOLN
60.0000 mg | Freq: Once | INTRAMUSCULAR | Status: AC
  Administered 2012-04-11: 60 mg via INTRAVENOUS
  Filled 2012-04-11: qty 6

## 2012-04-11 MED ORDER — ACETAMINOPHEN 325 MG PO TABS
650.0000 mg | ORAL_TABLET | Freq: Four times a day (QID) | ORAL | Status: DC | PRN
  Administered 2012-04-11: 650 mg via ORAL
  Filled 2012-04-11: qty 2

## 2012-04-11 MED ORDER — LORAZEPAM 2 MG/ML IJ SOLN
0.5000 mg | Freq: Once | INTRAMUSCULAR | Status: AC
  Administered 2012-04-11: 0.5 mg via INTRAVENOUS
  Filled 2012-04-11: qty 1

## 2012-04-11 MED ORDER — SODIUM CHLORIDE 0.9 % IJ SOLN
3.0000 mL | Freq: Two times a day (BID) | INTRAMUSCULAR | Status: DC
Start: 2012-04-11 — End: 2012-04-13
  Administered 2012-04-11 (×2): 3 mL via INTRAVENOUS

## 2012-04-11 MED ORDER — ZOLPIDEM TARTRATE 5 MG PO TABS: 5.0000 mg | ORAL_TABLET | Freq: Every evening | ORAL | Status: DC | PRN

## 2012-04-11 MED ORDER — FUROSEMIDE 10 MG/ML IJ SOLN
20.0000 mg | Freq: Two times a day (BID) | INTRAMUSCULAR | Status: DC
  Administered 2012-04-11: 20 mg via INTRAVENOUS
  Filled 2012-04-11 (×2): qty 2

## 2012-04-11 MED ORDER — METHADONE HCL 10 MG PO TABS
50.0000 mg | ORAL_TABLET | Freq: Every day | ORAL | Status: DC
  Administered 2012-04-11: 50 mg via ORAL
  Filled 2012-04-11: qty 1
  Filled 2012-04-11: qty 4

## 2012-04-11 MED ORDER — ONDANSETRON HCL 4 MG/2ML IJ SOLN: 4.0000 mg | Freq: Four times a day (QID) | INTRAMUSCULAR | Status: DC | PRN

## 2012-04-11 MED ORDER — SODIUM CHLORIDE 0.9 % IV SOLN: 250.0000 mL | INTRAVENOUS | Status: DC | PRN

## 2012-04-11 MED ORDER — FUROSEMIDE 10 MG/ML IJ SOLN
40.0000 mg | Freq: Two times a day (BID) | INTRAMUSCULAR | Status: DC
  Administered 2012-04-11 – 2012-04-12 (×2): 40 mg via INTRAVENOUS
  Filled 2012-04-11 (×3): qty 4

## 2012-04-11 MED ORDER — WARFARIN SODIUM 7.5 MG PO TABS
7.5000 mg | ORAL_TABLET | Freq: Every day | ORAL | Status: DC
  Administered 2012-04-11 – 2012-04-12 (×2): 7.5 mg via ORAL
  Filled 2012-04-11 (×3): qty 1

## 2012-04-11 MED ORDER — ONDANSETRON HCL 4 MG PO TABS: 4.0000 mg | ORAL_TABLET | Freq: Four times a day (QID) | ORAL | Status: DC | PRN

## 2012-04-11 MED ORDER — WARFARIN - PHARMACIST DOSING INPATIENT: Freq: Every day | Status: DC

## 2012-04-11 MED ORDER — SODIUM CHLORIDE 0.9 % IJ SOLN: 3.0000 mL | INTRAMUSCULAR | Status: DC | PRN

## 2012-04-11 MED ORDER — ALPRAZOLAM 0.25 MG PO TABS
0.2500 mg | ORAL_TABLET | Freq: Two times a day (BID) | ORAL | Status: DC | PRN
  Administered 2012-04-11 – 2012-04-13 (×5): 0.25 mg via ORAL
  Filled 2012-04-11 (×5): qty 1

## 2012-04-11 MED ORDER — BIOTENE DRY MOUTH MT LIQD
15.0000 mL | Freq: Two times a day (BID) | OROMUCOSAL | Status: DC
  Administered 2012-04-11 – 2012-04-13 (×3): 15 mL via OROMUCOSAL

## 2012-04-11 MED ORDER — SENNOSIDES-DOCUSATE SODIUM 8.6-50 MG PO TABS
1.0000 | ORAL_TABLET | Freq: Every evening | ORAL | Status: DC | PRN
  Administered 2012-04-11 – 2012-04-12 (×2): 1 via ORAL
  Filled 2012-04-11 (×2): qty 1

## 2012-04-11 NOTE — Progress Notes (Signed)
Pt seen and examined, admitted this am Admitted with CHF exacerbation Has chronic Afib foll by Dr.Crowell at [facility] Continue lasix, FU echo, I/Os, TSH /T4 Resume Methadone for chronic narcotic dependence  Zannie Cove, MD [phone]

## 2012-04-11 NOTE — H&P (Signed)
PCP:   Retta Mac, MD   Chief Complaint:  Shortness of breath  HPI: This is a pleasant 51 year old gentleman with a known history of paroxysmal A. fib he states approximately 6 PM yesterday evening he went into atrial fibrillation. He is maintained on Coumadin. He states it later in the night and he developed shortness of breath, feeling as though someone was choking him. He became very anxious, he had some slight nausea. She was unable to the flat in the bed and sleep. He came to the ER. He states he noted swelling on his feet for the past 2-3 days. He denies any chest pains, fever, chills, vomiting. He states he's never had congestive heart failure. He does not remember his last 2-D echo. History provided by the patient was alert and oriented, mildly anxious.    Review of Systems:  The patient denies anorexia, fever, weight loss, vision loss, decreased hearing, hoarseness, chest pain, syncope, dyspnea on exertion, balance deficits, hemoptysis, abdominal pain, melena, hematochezia, severe indigestion/heartburn, hematuria, incontinence, genital sores, muscle weakness, suspicious skin lesions, transient blindness, difficulty walking, depression, unusual weight change, abnormal bleeding, enlarged lymph nodes, angioedema, and breast masses.  Past Medical History: Past Medical History  Diagnosis Date  . Atrial fibrillation    Past Surgical History  Procedure Date  . Shoulder surgery   . Ankle surgery   . Wrist surgery     Medications: Prior to Admission medications   Medication Sig Start Date End Date Taking? Authorizing Provider  diltiazem (CARDIZEM) 60 MG tablet Take 60 mg by mouth 2 (two) times daily as needed. Takes when experiencing Atrial Fibrillation Usually takes 2 doses to control, may take up to 4 doses   Yes Historical Provider, MD  diltiazem (DILACOR XR) 240 MG 24 hr capsule Take 240 mg by mouth daily.   Yes Historical Provider, MD  warfarin (COUMADIN) 5 MG tablet Take 7.5  mg by mouth daily.   Yes Historical Provider, MD    Allergies:  No Known Allergies  Social History:  reports that he has never smoked. He does not have any smokeless tobacco history on file. He reports that he does not drink alcohol or use illicit drugs.  Family History: Family History  Problem Relation Age of Onset  . Heart failure Mother   . Stroke Other     Physical Exam: Filed Vitals:   04/10/12 2323 04/11/12 0000  BP: 112/64 110/58  Pulse: 69   Temp: 98.2 F (36.8 C)   TempSrc: Oral   Resp: 18 12  SpO2: 96%     General:  Alert and oriented times three, well developed and nourished, no acute distress Eyes: PERRLA, pink conjunctiva, no scleral icterus ENT: Moist oral mucosa, neck supple, no thyromegaly Lungs: clear to ascultation, no wheeze, no crackles, no use of accessory muscles Cardiovascular: irregular rate and rhythm, no regurgitation, no gallops, no murmurs. No carotid bruits, no JVD Abdomen: soft, positive BS, non-tender, non-distended, no organomegaly, not an acute abdomen GU: not examined Neuro: CN II - XII grossly intact, sensation intact Musculoskeletal: strength 5/5 all extremities, no clubbing, cyanosis or  2+ bilateral lower extremity pitting edema Skin: no rash, no subcutaneous crepitation, no decubitus, mild erythema bilateral lower extremity    Labs on Admission:   [facility] 04/10/12 2336  NA 137  K 4.2  CL 102  CO2 29  GLUCOSE 113*  BUN 16  CREATININE 0.80  CALCIUM 9.2  MG --  PHOS --    Basename 04/10/12 2336  AST 39*  ALT 16  ALKPHOS 105  BILITOT 0.3  PROT 7.0  ALBUMIN 3.4*   No results found for this basename: LIPASE:2,AMYLASE:2 in the last 72 hours  Basename 04/10/12 2336  WBC 8.6  NEUTROABS 4.6  HGB 13.8  HCT 40.5  MCV 87.9  PLT 201  Results for ACY, ORSAK (MRN [id number]) as of 04/11/2012 02:18  Ref. Range 04/11/2012 00:05  Prothrombin Time Latest Range: 11.6-15.2 seconds 32.0 (H)  INR Latest Range: 0.00-1.49  3.05  (H)  Results for PEYTON, SPENGLER (MRN [id number]) as of 04/11/2012 02:18  Ref. Range 04/10/2012 23:36 04/10/2012 23:53  Troponin i, poc Latest Range: 0.00-0.08 ng/mL  0.00  Pro B Natriuretic peptide (BNP) Latest Range: 0-125 pg/mL 274.4 (H)    No results found for this basename: CKTOTAL:3,CKMB:3,CKMBINDEX:3,TROPONINI:3 in the last 72 hours No components found with this basename: POCBNP:3 No results found for this basename: DDIMER:2 in the last 72 hours No results found for this basename: HGBA1C:2 in the last 72 hours No results found for this basename: CHOL:2,HDL:2,LDLCALC:2,TRIG:2,CHOLHDL:2,LDLDIRECT:2 in the last 72 hours No results found for this basename: TSH,T4TOTAL,FREET3,T3FREE,THYROIDAB in the last 72 hours No results found for this basename: VITAMINB12:2,FOLATE:2,FERRITIN:2,TIBC:2,IRON:2,RETICCTPCT:2 in the last 72 hours  Micro Results: No results found for this or any previous visit (from the past 240 hour(s)).   Radiological Exams on Admission: Dg Chest Portable 1 View  04/10/2012  *RADIOLOGY REPORT*  Clinical Data: Choking sensation and shortness of breath.  PORTABLE CHEST - 1 VIEW  Comparison: 07/29/2010 and chest CTA dated 07/30/2000.  Findings: Interval mild enlargement of the cardiac silhouette with increased prominence of the pulmonary vasculature and interstitial markings.  No pleural fluid.  Unremarkable bones.  IMPRESSION: Interval cardiomegaly and changes of congestive heart failure.   Original Report Authenticated By: Darrol Angel, M.D.     EKG: Atrial fibrillation  Assessment/Plan Present on Admission:  .CHF (congestive heart failure) Atrial fibrillation Admit to telemetry Lasix started, will continue 2-D echo in a.m. Continue Coumadin pharmacy to dose Continue home medications, atrial fibrillation currently well controlled  Will defer to a.m. team cardiology consult Check a TSH   Full code No need for DVT prophylaxis as patient on Coumadin   Maleya Leever,  Harshika Mago 04/11/2012, 2:18 AM

## 2012-04-11 NOTE — ED Notes (Signed)
Admitting MD is now at the bedside.  Patient reports he is feeling anxious.  Denies chest pain.  No s/sx of distress but states he feels weird.  Admitting MD will order clonidine

## 2012-04-11 NOTE — ED Notes (Signed)
See paper charting for downtime notes. 

## 2012-04-11 NOTE — Progress Notes (Signed)
  Echocardiogram 2D Echocardiogram has been performed.  Justin Kidd 04/11/2012, 8:44 AM

## 2012-04-11 NOTE — ED Notes (Signed)
Report given to receiving RN,  Patient resting.  Calmer at this time

## 2012-04-11 NOTE — Progress Notes (Signed)
ANTICOAGULATION CONSULT NOTE - Initial Consult  Pharmacy Consult for coumadin  Indication: atrial fibrillation  No Known Allergies  Patient Measurements: Height: 6\' 3"  (190.5 cm) Weight: 299 lb 9.7 oz (135.9 kg) (scale C) IBW/kg (Calculated) : 84.5  Heparin Dosing Weight:   Vital Signs: Temp: 98 F (36.7 C) (09/11 0357) Temp src: Oral (09/11 0357) BP: 140/72 mmHg (09/11 0357) Pulse Rate: 63  (09/11 0357)  Labs:  Basename 04/11/12 0252 04/11/12 0005 04/10/12 2336  HGB -- -- 13.8  HCT -- -- 40.5  PLT -- -- 201  APTT -- -- --  LABPROT -- 32.0* --  INR -- 3.05* --  HEPARINUNFRC -- -- --  CREATININE -- -- 0.80  CKTOTAL 1729* -- --  CKMB 3.7 -- --  TROPONINI <0.30 -- --    Estimated Creatinine Clearance: 164.2 ml/min (by C-G formula based on Cr of 0.8).   Medical History: Past Medical History  Diagnosis Date  . Atrial fibrillation   . Pneumonia   . Anxiety     Medications:  Prescriptions prior to admission  Medication Sig Dispense Refill  . diltiazem (CARDIZEM) 60 MG tablet Take 60 mg by mouth 2 (two) times daily as needed. Takes when experiencing Atrial Fibrillation Usually takes 2 doses to control, may take up to 4 doses      . diltiazem (DILACOR XR) 240 MG 24 hr capsule Take 240 mg by mouth daily.      Marland Kitchen warfarin (COUMADIN) 5 MG tablet Take 7.5 mg by mouth daily.        Assessment: 51 yo man with hx of PAF with adx for afib. Therapeutic inr.   Goal of Therapy:  INR 2-3 Monitor platelets by anticoagulation protocol: Yes   Plan:  Continue home dose of coumadin 7.5mg  and monitor inr daily.    Justin Kidd 04/11/2012,4:22 AM

## 2012-04-12 DIAGNOSIS — I4891 Unspecified atrial fibrillation: Secondary | ICD-10-CM

## 2012-04-12 DIAGNOSIS — I509 Heart failure, unspecified: Secondary | ICD-10-CM

## 2012-04-12 LAB — BASIC METABOLIC PANEL
CO2: 34 mEq/L — ABNORMAL HIGH (ref 19–32)
Chloride: 100 mEq/L (ref 96–112)
Creatinine, Ser: 0.82 mg/dL (ref 0.50–1.35)
Potassium: 4.2 mEq/L (ref 3.5–5.1)

## 2012-04-12 LAB — PROTIME-INR: INR: 2.62 — ABNORMAL HIGH (ref 0.00–1.49)

## 2012-04-12 MED ORDER — DILTIAZEM HCL ER 120 MG PO CP24
120.0000 mg | ORAL_CAPSULE | Freq: Every day | ORAL | Status: DC
  Administered 2012-04-12 – 2012-04-13 (×2): 120 mg via ORAL
  Filled 2012-04-12 (×3): qty 1

## 2012-04-12 MED ORDER — METHADONE HCL 10 MG PO TABS
20.0000 mg | ORAL_TABLET | Freq: Every day | ORAL | Status: DC
  Administered 2012-04-12: 20 mg via ORAL
  Filled 2012-04-12: qty 2

## 2012-04-12 MED ORDER — FUROSEMIDE 40 MG PO TABS
40.0000 mg | ORAL_TABLET | Freq: Two times a day (BID) | ORAL | Status: DC
  Administered 2012-04-12 – 2012-04-13 (×3): 40 mg via ORAL
  Filled 2012-04-12 (×6): qty 1

## 2012-04-12 NOTE — Plan of Care (Signed)
Problem: Phase I Progression Outcomes Goal: EF % per last Echo/documented,Core Reminder form on chart Outcome: Completed/Met Date Met:  04/12/12 EF 60%,04-11-12

## 2012-04-12 NOTE — Plan of Care (Signed)
Problem: Food- and Nutrition-Related Knowledge Deficit (NB-1.1) Goal: Nutrition education Formal process to instruct or train a patient/client in a skill or to impart knowledge to help patients/clients voluntarily manage or modify food choices and eating behavior to maintain or improve health.  Outcome: Completed/Met Date Met:  04/12/12 Nutrition Education Note  RD consulted for nutrition education regarding new onset CHF.  RD provided "Low Sodium Nutrition Therapy" handout from the Academy of [employer]etics. Reviewed patient's dietary recall. Provided examples on ways to decrease sodium intake in diet. Discouraged intake of processed foods and use of salt shaker. Encouraged fresh fruits and vegetables as well as whole grain sources of carbohydrates to maximize fiber intake.   RD discussed why it is important for patient to adhere to diet recommendations, and emphasized the role of fluids, foods to avoid, and importance of weighing self daily.  Expect good compliance.  Body mass index is 36.63 kg/(m^2). Pt meets criteria for obesity class 2 based on current BMI.  Current diet order is heart healthy, patient is consuming approximately 100% of meals at this time. Labs and medications reviewed. No further nutrition interventions warranted at this time. RD contact information provided. If additional nutrition issues arise, please re-consult RD.   Clarene Duke RD, LDN Pager [phone] After Hours pager [phone]

## 2012-04-12 NOTE — Progress Notes (Signed)
[facility]             Progress Note   Assessment/Plan:  Active Problems: 1. CHF (congestive heart failure) Likely triggered by Afib Improved Echo with normal EF Change lasix to PO Diuresed >3L  2. Afib with bradycardia and sinus pauses Will cut down diltiazem and cut down methadone Monitor on tele TSH/T4 normal Continue coumadin Fu with Dr.Crowell next week  3. Chronic pain/narcotic dependence: Cut down methadone due to bradycardia/sinus pause  DIspo: home in 1-2 days    LOS: 2 days   K[facility] Belluomini [facility] Pager: [phone] 04/12/2012, 8:21 AM   Subjective: Breathing much better, denies any symptoms  Objective: Vital signs in last 24 hours: Temp:  [97.7 F (36.5 C)-98.2 F (36.8 C)] 97.7 F (36.5 C) (09/12 0530) Pulse Rate:  [67-75] 69  (09/12 0530) Resp:  [18] 18  (09/12 0530) BP: (106-140)/(61-80) 108/74 mmHg (09/12 0530) SpO2:  [95 %-100 %] 100 % (09/12 0530) Weight:  [132.949 kg (293 lb 1.6 oz)] 132.949 kg (293 lb 1.6 oz) (09/12 0530) Weight change: 12.746 kg (28 lb 1.6 oz) Last BM Date: 04/10/12  Intake/Output from previous day: 09/11 0701 - 09/12 0700 In: 484 [P.O.:480; IV Piggyback:4] Out: 2100 [Urine:2100] Total I/O In: 360 [P.O.:360] Out: -    Physical Exam: General: Alert, awake, oriented x3, in no acute distress. HEENT: No bruits, no goiter. Heart: Regular rate and rhythm, without murmurs, rubs, gallops. Lungs: Clear to auscultation bilaterally. Abdomen: Soft, nontender, nondistended, positive bowel sounds. Extremities: trace edema, No clubbing cyanosis with positive pedal pulses. Neuro: Grossly intact, nonfocal.    Lab Results: Basic Metabolic Panel:  Basename 04/12/12 0520 04/11/12 0615  NA 139 138  K 4.2 4.4  CL 100 100  CO2 34* 28  GLUCOSE 103* 123*  BUN 16 14  CREATININE 0.82 0.78  CALCIUM 9.7 9.7  MG -- --  PHOS -- --   Liver Function Tests:  [facility] 04/10/12 2336  AST 39*  ALT 16    ALKPHOS 105  BILITOT 0.3  PROT 7.0  ALBUMIN 3.4*   No results found for this basename: LIPASE:2,AMYLASE:2 in the last 72 hours No results found for this basename: AMMONIA:2 in the last 72 hours CBC:  Basename 04/11/12 0615 04/10/12 2336  WBC 9.3 8.6  NEUTROABS -- 4.6  HGB 14.7 13.8  HCT 43.7 40.5  MCV 88.3 87.9  PLT 227 201   Cardiac Enzymes:  Basename 04/11/12 1438 04/11/12 0941 04/11/12 0252  CKTOTAL -- -- 1729*  CKMB -- -- 3.7  CKMBINDEX -- -- --  TROPONINI <0.30 <0.30 <0.30   BNP:  Basename 04/11/12 0615 04/10/12 2336  PROBNP 652.1* 274.4*   D-Dimer: No results found for this basename: DDIMER:2 in the last 72 hours CBG: No results found for this basename: GLUCAP:6 in the last 72 hours Hemoglobin A1C: No results found for this basename: HGBA1C in the last 72 hours Fasting Lipid Panel: No results found for this basename: CHOL,HDL,LDLCALC,TRIG,CHOLHDL,LDLDIRECT in the last 72 hours Thyroid Function Tests:  Basename 04/11/12 1438  TSH 0.952  T4TOTAL --  FREET4 1.12  T3FREE --  THYROIDAB --   Anemia Panel: No results found for this basename: VITAMINB12,FOLATE,FERRITIN,TIBC,IRON,RETICCTPCT in the last 72 hours Coagulation:  Basename 04/12/12 0520 04/11/12 0615  LABPROT 28.4* 29.1*  INR 2.62* 2.70*   Urine Drug Screen: Drugs of Abuse  No results found for this basename: labopia, cocainscrnur, labbenz, amphetmu, thcu, labbarb    Alcohol Level: No results found for this basename:  ETH:2 in the last 72 hours Urinalysis: No results found for this basename: COLORURINE:2,APPERANCEUR:2,LABSPEC:2,PHURINE:2,GLUCOSEU:2,HGBUR:2,BILIRUBINUR:2,KETONESUR:2,PROTEINUR:2,UROBILINOGEN:2,NITRITE:2,LEUKOCYTESUR:2 in the last 72 hours  No results found for this or any previous visit (from the past 240 hour(s)).  Studies/Results: Dg Chest Portable 1 View  04/10/2012  *RADIOLOGY REPORT*  Clinical Data: Choking sensation and shortness of breath.  PORTABLE CHEST - 1 VIEW   Comparison: 07/29/2010 and chest CTA dated 07/30/2000.  Findings: Interval mild enlargement of the cardiac silhouette with increased prominence of the pulmonary vasculature and interstitial markings.  No pleural fluid.  Unremarkable bones.  IMPRESSION: Interval cardiomegaly and changes of congestive heart failure.   Original Report Authenticated By: Darrol Angel, M.D.     Medications: Scheduled Meds:   . antiseptic oral rinse  15 mL Mouth Rinse BID  . diltiazem  120 mg Oral Daily  . furosemide  40 mg Oral BID  . methadone  20 mg Oral Daily  . pneumococcal 23 valent vaccine  0.5 mL Intramuscular Tomorrow-1000  . sodium chloride  3 mL Intravenous Q12H  . sodium chloride  3 mL Intravenous Q12H  . warfarin  7.5 mg Oral q1800  . Warfarin - Pharmacist Dosing Inpatient   Does not apply q1800  . DISCONTD: diltiazem  240 mg Oral Daily  . DISCONTD: furosemide  20 mg Intravenous Q12H  . DISCONTD: furosemide  40 mg Intravenous Q12H  . DISCONTD: methadone  50 mg Oral Daily  . DISCONTD: methadone  50 mg Oral Daily  . DISCONTD: warfarin  7.5 mg Oral Daily   Continuous Infusions:  PRN Meds:.sodium chloride, acetaminophen, acetaminophen, ALPRAZolam, alum & mag hydroxide-simeth, ondansetron (ZOFRAN) IV, ondansetron, senna-docusate, sodium chloride, zolpidem  Assessment/Plan:  Active Problems: 1. CHF (congestive heart failure) Likely triggered by Afib Improved Echo with normal EF Change lasix to PO  2. Afib with bradycardia and sinus pauses Will cut down diltiazem and cut down methadone Monitor on tele TSH/T4 normal Continue coumadin Fu with Dr.Crowell next week  3. Chronic pain/narcotic dependence: Cut down methadone due to bradycardia/sinus pause  DIspo: home in 1-2 days    LOS: 2 days   [facility] Pager: [phone] 04/12/2012, 8:21 AM

## 2012-04-12 NOTE — Progress Notes (Signed)
ANTICOAGULATION CONSULT NOTE - Follow Up Consult  Pharmacy Consult for Coumadin Indication: atrial fibrillation  No Known Allergies  Patient Measurements: Height: 6\' 3"  (190.5 cm) Weight: 293 lb 1.6 oz (132.949 kg) (scale c) IBW/kg (Calculated) : 84.5  Heparin Dosing Weight:   Vital Signs: Temp: 97.7 F (36.5 C) (09/12 0530) Temp src: Oral (09/12 0530) BP: 108/74 mmHg (09/12 0530) Pulse Rate: 69  (09/12 0530)  Labs:  Alvira Philips 04/12/12 0520 04/11/12 1438 04/11/12 0941 04/11/12 0615 04/11/12 0252 04/11/12 0005 04/10/12 2336  HGB -- -- -- 14.7 -- -- 13.8  HCT -- -- -- 43.7 -- -- 40.5  PLT -- -- -- 227 -- -- 201  APTT -- -- -- -- -- -- --  LABPROT 28.4* -- -- 29.1* -- 32.0* --  INR 2.62* -- -- 2.70* -- 3.05* --  HEPARINUNFRC -- -- -- -- -- -- --  CREATININE 0.82 -- -- 0.78 -- -- 0.80  CKTOTAL -- -- -- -- 1729* -- --  CKMB -- -- -- -- 3.7 -- --  TROPONINI -- <0.30 <0.30 -- <0.30 -- --    Estimated Creatinine Clearance: 158.4 ml/min (by C-G formula based on Cr of 0.82).  Assessment: 51yom continuing on Coumadin for Afib. INR (2.62) remains therapeutic on home regimen (7.5mg  daily) - will continue. - No CBC this AM - No significant bleeding reported  Goal of Therapy:  INR 2-3   Plan:  1. Continue Coumadin 7.5mg  daily 2. Follow-up AM INR  Cleon Dew [phone] 04/12/2012,9:05 AM

## 2012-04-13 LAB — BASIC METABOLIC PANEL
BUN: 15 mg/dL (ref 6–23)
Chloride: 99 mEq/L (ref 96–112)
GFR calc Af Amer: >90 mL/min (ref >90)
Potassium: 4.4 mEq/L (ref 3.5–5.1)
Sodium: 141 mEq/L (ref 135–145)

## 2012-04-13 LAB — PROTIME-INR
INR: 2.24 — ABNORMAL HIGH (ref 0.00–1.49)
Prothrombin Time: 25.2 seconds — ABNORMAL HIGH (ref 11.6–15.2)

## 2012-04-13 MED ORDER — DILTIAZEM HCL ER COATED BEADS 120 MG PO CP24: 120.0000 mg | ORAL_CAPSULE | Freq: Every day | ORAL | Status: DC

## 2012-04-13 MED ORDER — FUROSEMIDE 40 MG PO TABS: 40.0000 mg | ORAL_TABLET | Freq: Every day | ORAL | Status: DC

## 2012-04-13 MED ORDER — ALPRAZOLAM 0.25 MG PO TABS: 0.2500 mg | ORAL_TABLET | Freq: Two times a day (BID) | ORAL | Status: AC | PRN

## 2012-04-13 MED ORDER — POTASSIUM CHLORIDE ER 10 MEQ PO TBCR: 20.0000 meq | EXTENDED_RELEASE_TABLET | Freq: Every day | ORAL | Status: DC

## 2012-04-13 NOTE — Progress Notes (Signed)
Went over all D/C info with pt. HF booklet, daily weights, Na restrictions and follow up appts. Marisa Cyphers RN

## 2012-04-13 NOTE — Plan of Care (Signed)
Problem: Phase II Progression Outcomes Goal: Pain controlled Outcome: Adequate for Discharge Pt was on Methadone weaned off and MD given prescription for Xanax

## 2012-04-25 NOTE — Discharge Summary (Addendum)
Physician Discharge Summary  Patient ID: Justin Kidd MRN: [id number] DOB/AGE: 50-Sep-1962 50 y.o.  Admit date: 04/10/2012 Discharge date: 04/14/2012  Primary Care Physician:  Retta Mac, MD   Discharge Diagnoses:    Active Problems:  CHF (congestive heart failure), preserved EF  Atrial fibrillation  Chronic pain on methadone  Bradycardia SInus pause     Medication List     As of 04/25/2012  8:49 PM    STOP taking these medications         diltiazem 240 MG 24 hr capsule   Commonly known as: DILACOR XR      methadone 10 MG/ML solution   Commonly known as: DOLOPHINE      TAKE these medications         ALPRAZolam 0.25 MG tablet   Commonly known as: XANAX   Take 1 tablet (0.25 mg total) by mouth 2 (two) times daily as needed for anxiety.      diltiazem 120 MG 24 hr capsule   Commonly known as: CARDIZEM CD   Take 1 capsule (120 mg total) by mouth daily.      diltiazem 60 MG tablet   Commonly known as: CARDIZEM   Take 60 mg by mouth 2 (two) times daily as needed. Takes when experiencing Atrial Fibrillation  Usually takes 2 doses to control, may take up to 4 doses      furosemide 40 MG tablet   Commonly known as: LASIX   Take 1 tablet (40 mg total) by mouth daily.      potassium chloride 10 MEQ tablet   Commonly known as: K-DUR   Take 2 tablets (20 mEq total) by mouth daily.      warfarin 5 MG tablet   Commonly known as: COUMADIN   Take 7.5 mg by mouth daily.         Disposition and Follow-up:  Dr.Crowell in 1 week  Significant Diagnostic Studies:  CXR IMPRESSION:  Interval cardiomegaly and changes of congestive heart failure.   2D echo - Left ventricle: Technically limited study. But overall LV function is good. The cavity size was normal. Wall thickness was normal. The estimated ejection fraction was 60%. Regional wall motion abnormalities cannot be excluded. - Left atrium: The atrium was mildly dilated. - Right ventricle: I can not assess  RV function, but I suspect it is OK. The RV does not appear to be dilated.   Brief H and P: This is a pleasant 51 year old gentleman with a known history of paroxysmal A. fib he states approximately 6 PM yesterday evening he went into atrial fibrillation. He is maintained on Coumadin. He states it later in the night and he developed shortness of breath, feeling as though someone was choking him. He became very anxious, he had some slight nausea. She was unable to the flat in the bed and sleep. He came to the ER. He states he noted swelling on his feet for the past 2-3 days. He denies any chest pains, fever, chills, vomiting. He states he's never had congestive heart failure. He does not remember his last 2-D echo. History provided by the patient was alert and oriented, mildly anxious.   Hospital Course:   1. CHF (congestive heart failure)  Likely triggered by Afib  Improved with diuresis Echo with normal EF of 60% Changed lasix to PO  Diuresed >4L   2. Afib with bradycardia and sinus pauses  Will cut down diltiazem and cut down methadone  Monitor on tele  TSH/T4  normal  Continue coumadin  Pauses improved with stopping methadone Fu with Dr.Crowell next week   3. Chronic pain/narcotic dependence:  Cut down methadone due to bradycardia/sinus pause  And subsequently methadone was stopped , was able to tolerate this without withdrawal symptoms     Time spent on Discharge: Signed: Ollie Esty [facility] 04/25/2012, 8:49 PM

## 2012-05-03 ENCOUNTER — Ambulatory Visit (HOSPITAL_COMMUNITY): Admission: RE | Admit: 2012-05-03 | Payer: Self-pay | Source: Home / Self Care | Admitting: Psychiatry

## 2012-07-29 ENCOUNTER — Emergency Department (HOSPITAL_COMMUNITY): Payer: Self-pay

## 2012-07-29 ENCOUNTER — Emergency Department (HOSPITAL_COMMUNITY)
Admission: EM | Admit: 2012-07-29 | Discharge: 2012-07-29 | Disposition: A | Discharge status: Home / Self Care | Payer: Self-pay | Attending: Emergency Medicine | Admitting: Emergency Medicine

## 2012-07-29 ENCOUNTER — Encounter (HOSPITAL_COMMUNITY): Payer: Self-pay | Admitting: *Deleted

## 2012-07-29 DIAGNOSIS — F419 Anxiety disorder, unspecified: Secondary | ICD-10-CM

## 2012-07-29 DIAGNOSIS — Z79899 Other long term (current) drug therapy: Secondary | ICD-10-CM | POA: Insufficient documentation

## 2012-07-29 DIAGNOSIS — Z7901 Long term (current) use of anticoagulants: Secondary | ICD-10-CM | POA: Insufficient documentation

## 2012-07-29 DIAGNOSIS — Z8679 Personal history of other diseases of the circulatory system: Secondary | ICD-10-CM

## 2012-07-29 DIAGNOSIS — F3289 Other specified depressive episodes: Secondary | ICD-10-CM | POA: Insufficient documentation

## 2012-07-29 DIAGNOSIS — I4891 Unspecified atrial fibrillation: Secondary | ICD-10-CM | POA: Insufficient documentation

## 2012-07-29 DIAGNOSIS — F329 Major depressive disorder, single episode, unspecified: Secondary | ICD-10-CM | POA: Insufficient documentation

## 2012-07-29 DIAGNOSIS — F411 Generalized anxiety disorder: Secondary | ICD-10-CM | POA: Insufficient documentation

## 2012-07-29 DIAGNOSIS — Z8701 Personal history of pneumonia (recurrent): Secondary | ICD-10-CM | POA: Insufficient documentation

## 2012-07-29 DIAGNOSIS — I509 Heart failure, unspecified: Secondary | ICD-10-CM | POA: Insufficient documentation

## 2012-07-29 LAB — POCT I-STAT TROPONIN I: Troponin i, poc: 0 ng/mL (ref 0.00–0.08)

## 2012-07-29 LAB — CBC WITH DIFFERENTIAL/PLATELET
HCT: 41.6 % (ref 39.0–52.0)
Hemoglobin: 13.5 g/dL (ref 13.0–17.0)
Lymphocytes Relative: 13 % (ref 12–46)
Lymphs Abs: 0.9 10*3/uL (ref 0.7–4.0)
Monocytes Absolute: 0.4 10*3/uL (ref 0.1–1.0)
Monocytes Relative: 6 % (ref 3–12)
Neutro Abs: 5.3 10*3/uL (ref 1.7–7.7)
Neutrophils Relative %: 80 % — ABNORMAL HIGH (ref 43–77)
RBC: 4.71 MIL/uL (ref 4.22–5.81)
WBC: 6.6 10*3/uL (ref 4.0–10.5)

## 2012-07-29 LAB — BASIC METABOLIC PANEL
BUN: 14 mg/dL (ref 6–23)
CO2: 27 mEq/L (ref 19–32)
Chloride: 104 mEq/L (ref 96–112)
Creatinine, Ser: 0.74 mg/dL (ref 0.50–1.35)
Potassium: 4.1 mEq/L (ref 3.5–5.1)

## 2012-07-29 LAB — PRO B NATRIURETIC PEPTIDE: Pro B Natriuretic peptide (BNP): 388.1 pg/mL — ABNORMAL HIGH (ref 0–125)

## 2012-07-29 MED ORDER — LORAZEPAM 1 MG PO TABS
1.0000 mg | ORAL_TABLET | Freq: Once | ORAL | Status: AC
  Administered 2012-07-29: 1 mg via ORAL
  Filled 2012-07-29: qty 1

## 2012-07-29 MED ORDER — LORAZEPAM 1 MG PO TABS: 1.0000 mg | ORAL_TABLET | Freq: Three times a day (TID) | ORAL | Status: DC | PRN

## 2012-07-29 NOTE — ED Notes (Signed)
Patient states he thinks he had a panic attack.  He became short of breath.  Patient is taking cymbalta, but  Missed doses for 3 to 4 days.  Patient states he was just resting and the sx came on.  Patient denies chest pain.  Patient is calm at present but states he still feels a little anxious.  Patient does not have prn meds for anxiety

## 2012-07-29 NOTE — ED Provider Notes (Signed)
Medical screening examination/treatment/procedure(s) were performed by non-physician practitioner and as supervising physician I was immediately available for consultation/collaboration.   Gavin Pound. Nikeisha Klutz, MD 07/29/12 1651

## 2012-07-29 NOTE — ED Provider Notes (Signed)
History     CSN: [id number]  Arrival date & time 07/29/12  4098   First MD Initiated Contact with Patient 07/29/12 1001      Chief Complaint  Patient presents with  . Shortness of Breath    (Consider location/radiation/quality/duration/timing/severity/associated sxs/prior treatment) The history is provided by the patient and medical records.    Justin Kidd is a 51 y.o. male  with a hx of a-fib, CHF, depression, anxiety presents to the Emergency Department complaining of gradual, persistent, progressively worsening shortness of breath onset this morning at 7:30am.  Pt states he awoke feeling short of breath and could not lay back. Pt states it feels as if someone is choking him and it has not improved since he awoke. Pt states she has not had his Cymbalta in the last 3-4 days,  Associated symptoms include vomiting x2 this AM.  Patient states he has been buying methadone off the street and taking it for his back pain. Nothing makes the shortness of breath better and nothing makes it worse.  Pt denies fever, chills, headache, neck pain, chest pain, abdominal pain, diarrhea, weakness or dizziness, syncope, diaphoresis.     Past Medical History  Diagnosis Date  . Atrial fibrillation   . Pneumonia   . Anxiety     Past Surgical History  Procedure Date  . Shoulder surgery   . Ankle surgery   . Wrist surgery     Family History  Problem Relation Age of Onset  . Heart failure Mother   . Stroke Other     History  Substance Use Topics  . Smoking status: Never Smoker   . Smokeless tobacco: Never Used  . Alcohol Use: No      Review of Systems  Constitutional: Negative for fever, diaphoresis, appetite change, fatigue and unexpected weight change.  HENT: Negative for congestion, rhinorrhea, mouth sores, neck stiffness and postnasal drip.   Eyes: Negative for visual disturbance.  Respiratory: Positive for shortness of breath. Negative for cough, chest tightness and wheezing.     Cardiovascular: Negative for chest pain.  Gastrointestinal: Negative for nausea, vomiting, abdominal pain, diarrhea and constipation.  Genitourinary: Negative for dysuria, urgency, frequency and hematuria.  Skin: Negative for rash.  Neurological: Negative for syncope, light-headedness and headaches.  Psychiatric/Behavioral: Negative for sleep disturbance. The patient is nervous/anxious.   All other systems reviewed and are negative.    Allergies  Review of patient's allergies indicates no known allergies.  Home Medications   Current Outpatient Rx  Name  Route  Sig  Dispense  Refill  . DILTIAZEM HCL ER COATED BEADS 240 MG PO CP24   Oral   Take 240 mg by mouth daily.         . DULOXETINE HCL 60 MG PO CPEP   Oral   Take 60 mg by mouth daily.         . FUROSEMIDE 20 MG PO TABS   Oral   Take 20 mg by mouth daily.         . TRAZODONE HCL 100 MG PO TABS   Oral   Take 200 mg by mouth at bedtime.         . WARFARIN SODIUM 5 MG PO TABS   Oral   Take 7.5 mg by mouth daily.         Marland Kitchen LORAZEPAM 1 MG PO TABS   Oral   Take 1 tablet (1 mg total) by mouth every 8 (eight) hours as needed for  anxiety.   5 tablet   0     BP 115/69  Pulse 76  Temp 98.3 F (36.8 C) (Oral)  Resp 12  Ht 6\' 3"  (1.905 m)  Wt 268 lb (121.564 kg)  BMI 33.50 kg/m2  SpO2 100%  Physical Exam  Nursing note and vitals reviewed. Constitutional: He is oriented to person, place, and time. He appears well-developed and well-nourished. No distress.  HENT:  Head: Normocephalic and atraumatic.  Right Ear: Tympanic membrane, external ear and ear canal normal.  Left Ear: Tympanic membrane, external ear and ear canal normal.  Nose: Nose normal.  Mouth/Throat: Uvula is midline, oropharynx is clear and moist and mucous membranes are normal. No oropharyngeal exudate.  Eyes: Conjunctivae normal are normal. Pupils are equal, round, and reactive to light. No scleral icterus.  Neck: Normal range of motion.  Neck supple.  Cardiovascular: Normal rate, normal heart sounds and intact distal pulses.  An irregularly irregular rhythm present. Exam reveals no gallop and no friction rub.   No murmur heard. Pulmonary/Chest: Effort normal and breath sounds normal. No respiratory distress. He has no wheezes. He has no rales. He exhibits no tenderness.  Abdominal: Soft. Bowel sounds are normal. He exhibits no distension and no mass. There is no tenderness. There is no rebound and no guarding.  Musculoskeletal: Normal range of motion. He exhibits no edema and no tenderness.  Neurological: He is alert and oriented to person, place, and time. He exhibits normal muscle tone. Coordination normal.       Speech is clear and goal oriented Moves extremities without ataxia  Skin: Skin is warm and dry. No rash noted. He is not diaphoretic. No erythema.  Psychiatric: His mood appears anxious.       Pt very anxious and tearful throughout exam    ED Course  Procedures (including critical care time)  Labs Reviewed  CBC WITH DIFFERENTIAL - Abnormal; Notable for the following:    Neutrophils Relative 80 (*)     All other components within normal limits  BASIC METABOLIC PANEL - Abnormal; Notable for the following:    Glucose, Bld 122 (*)     All other components within normal limits  PRO B NATRIURETIC PEPTIDE - Abnormal; Notable for the following:    Pro B Natriuretic peptide (BNP) 388.1 (*)     All other components within normal limits  POCT I-STAT TROPONIN I   Dg Chest 2 View  07/29/2012  *RADIOLOGY REPORT*  Clinical Data: , shortness of breath  CHEST - 2 VIEW  Comparison: 04/10/2012  Findings: The heart size and mediastinal contours are within normal limits.  Both lungs are clear.  The visualized skeletal structures are unremarkable.  IMPRESSION: No acute cardiopulmonary abnormalities.   Original Report Authenticated By: Signa Kell, M.D.     ECG:  Date: 07/29/2012  Rate: 84  Rhythm: atrial fibrillation   QRS Axis: normal  Intervals: n/a  ST/T Wave abnormalities: normal  Conduction Disutrbances:none  Narrative Interpretation: non-ischemic ECG with rate controlled a-fib  Old EKG Reviewed: unchanged    1. Anxiety   2. Atrial fibrillation   3. H/O CHF       MDM  Lynelle Doctor presents with Hx of a-fib and CHF today with SOB and anxiety.  ECG with rate cotrolled a-fib and non-ischemic findings.  BMP within normal limits, BNP 388, around patient's baseline; CBC unremarkable, troponin negative chest x-ray negative.  She given Ativan here in the emergency department with resolution of his anxiety  and shortness of breath.  Patient without an elevated BNP no swelling of his legs therefore I doubt CHF. Patient A. fib his rate controlled and has been well under 100 beats per minute throughout his time here in emergency department. I doubt that his A. fib is causing his shortness of breath. Patient states he'll be able to fill his Cymbalta prescription tomorrow but he is requesting an Ativan prescription.  He is no longer crying.  I have counseled him on the dangers of buying medication off the street and we have discussed the need for him to return to his orthopedist for further pain management for his back. Also discussed the need for him to continue taking his Cymbalta as prescribed is coming off of it abruptly can cause symptoms like today. I have also discussed reasons to return immediately to the ER.  Patient expresses understanding and agrees with plan.  1. Medications: ativan, usual home medications 2. Treatment: rest, drink plenty of fluids, take medications as prescribed 3. Follow Up: Please followup with your primary doctor for discussion of your diagnoses and further evaluation after today's visit; if you do not have a primary care doctor use the resource guide provided to find one; followup with orthopedics for your chronic back pain and pain management.      Dahlia Client Terek Bee,  PA-C 07/29/12 1222

## 2012-07-29 NOTE — ED Notes (Signed)
Pt discharged to home with family. NAD.  

## 2012-12-07 ENCOUNTER — Emergency Department (HOSPITAL_BASED_OUTPATIENT_CLINIC_OR_DEPARTMENT_OTHER)
Admission: EM | Admit: 2012-12-07 | Discharge: 2012-12-07 | Disposition: A | Discharge status: Home / Self Care | Payer: Self-pay | Attending: Emergency Medicine | Admitting: Emergency Medicine

## 2012-12-07 ENCOUNTER — Emergency Department (HOSPITAL_BASED_OUTPATIENT_CLINIC_OR_DEPARTMENT_OTHER): Payer: Self-pay

## 2012-12-07 ENCOUNTER — Encounter (HOSPITAL_BASED_OUTPATIENT_CLINIC_OR_DEPARTMENT_OTHER): Payer: Self-pay | Admitting: *Deleted

## 2012-12-07 DIAGNOSIS — Y990 Civilian activity done for income or pay: Secondary | ICD-10-CM | POA: Insufficient documentation

## 2012-12-07 DIAGNOSIS — S2239XA Fracture of one rib, unspecified side, initial encounter for closed fracture: Secondary | ICD-10-CM | POA: Insufficient documentation

## 2012-12-07 DIAGNOSIS — Z8701 Personal history of pneumonia (recurrent): Secondary | ICD-10-CM | POA: Insufficient documentation

## 2012-12-07 DIAGNOSIS — Y93H9 Activity, other involving exterior property and land maintenance, building and construction: Secondary | ICD-10-CM | POA: Insufficient documentation

## 2012-12-07 DIAGNOSIS — W010XXA Fall on same level from slipping, tripping and stumbling without subsequent striking against object, initial encounter: Secondary | ICD-10-CM | POA: Insufficient documentation

## 2012-12-07 DIAGNOSIS — Z7901 Long term (current) use of anticoagulants: Secondary | ICD-10-CM | POA: Insufficient documentation

## 2012-12-07 DIAGNOSIS — I4891 Unspecified atrial fibrillation: Secondary | ICD-10-CM | POA: Insufficient documentation

## 2012-12-07 DIAGNOSIS — S2231XA Fracture of one rib, right side, initial encounter for closed fracture: Secondary | ICD-10-CM

## 2012-12-07 DIAGNOSIS — Y9289 Other specified places as the place of occurrence of the external cause: Secondary | ICD-10-CM | POA: Insufficient documentation

## 2012-12-07 DIAGNOSIS — Z79899 Other long term (current) drug therapy: Secondary | ICD-10-CM | POA: Insufficient documentation

## 2012-12-07 DIAGNOSIS — F411 Generalized anxiety disorder: Secondary | ICD-10-CM | POA: Insufficient documentation

## 2012-12-07 MED ORDER — HYDROCODONE-ACETAMINOPHEN 5-325 MG PO TABS: 1.0000 | ORAL_TABLET | ORAL | Status: DC | PRN

## 2012-12-07 NOTE — ED Provider Notes (Signed)
History     CSN: [id number]  Arrival date & time 12/07/12  1827   First MD Initiated Contact with Patient 12/07/12 1834      Chief Complaint  Patient presents with  . Fall    (Consider location/radiation/quality/duration/timing/severity/associated sxs/prior treatment) HPI Comments: Patient comes to the ER for evaluation after a fall. Patient reports that he lost his balance and fell onto his right side. He is complaining of pain around the right side ribs since the fall which occurred earlier today. Pain is moderate to severe, worsens with movement. He denied his head. No loss of consciousness. Patient denies shortness of breath. There is no back pain. Patient denies abdominal discomfort.  Patient is a 52 y.o. male presenting with fall.  Fall    Past Medical History  Diagnosis Date  . Atrial fibrillation   . Pneumonia   . Anxiety     Past Surgical History  Procedure Laterality Date  . Shoulder surgery    . Ankle surgery    . Wrist surgery      Family History  Problem Relation Age of Onset  . Heart failure Mother   . Stroke Other     History  Substance Use Topics  . Smoking status: Never Smoker   . Smokeless tobacco: Never Used  . Alcohol Use: No      Review of Systems  HENT: Negative for neck pain.   Respiratory: Negative for shortness of breath.   Musculoskeletal: Negative for back pain.       Rib pain  All other systems reviewed and are negative.    Allergies  Review of patient's allergies indicates no known allergies.  Home Medications   Current Outpatient Rx  Name  Route  Sig  Dispense  Refill  . diltiazem (CARDIZEM CD) 240 MG 24 hr capsule   Oral   Take 240 mg by mouth daily.         . DULoxetine (CYMBALTA) 60 MG capsule   Oral   Take 60 mg by mouth daily.         . furosemide (LASIX) 20 MG tablet   Oral   Take 20 mg by mouth daily.         Marland Kitchen LORazepam (ATIVAN) 1 MG tablet   Oral   Take 1 tablet (1 mg total) by mouth every 8  (eight) hours as needed for anxiety.   5 tablet   0   . traZODone (DESYREL) 100 MG tablet   Oral   Take 200 mg by mouth at bedtime.         Marland Kitchen warfarin (COUMADIN) 5 MG tablet   Oral   Take 7.5 mg by mouth daily.           BP 124/63  Pulse 85  Temp(Src) 98.6 F (37 C)  Resp 18  Ht 6\' 3"  (1.905 m)  Wt 265 lb (120.203 kg)  BMI 33.12 kg/m2  SpO2 98%  Physical Exam  Constitutional: He is oriented to person, place, and time. He appears well-developed and well-nourished. No distress.  HENT:  Head: Normocephalic and atraumatic.  Right Ear: Hearing normal.  Left Ear: Hearing normal.  Nose: Nose normal.  Mouth/Throat: Oropharynx is clear and moist and mucous membranes are normal.  Eyes: Conjunctivae and EOM are normal. Pupils are equal, round, and reactive to light.  Neck: Normal range of motion. Neck supple.  Cardiovascular: Regular rhythm, S1 normal and S2 normal.  Exam reveals no gallop and no friction rub.  No murmur heard. Pulmonary/Chest: Effort normal and breath sounds normal. No respiratory distress. He exhibits tenderness.  Tender right anterior and lateral mid-rib field, no crepitance  Abdominal: Soft. Normal appearance and bowel sounds are normal. There is no hepatosplenomegaly. There is no tenderness. There is no rebound, no guarding, no tenderness at McBurney's point and negative Murphy's sign. No hernia.  Musculoskeletal: Normal range of motion.  Neurological: He is alert and oriented to person, place, and time. He has normal strength. No cranial nerve deficit or sensory deficit. Coordination normal. GCS eye subscore is 4. GCS verbal subscore is 5. GCS motor subscore is 6.  Skin: Skin is warm, dry and intact. No rash noted. No cyanosis.  Psychiatric: He has a normal mood and affect. His speech is normal and behavior is normal. Thought content normal.    ED Course  Procedures (including critical care time)  Labs Reviewed - No data to display Dg Ribs Unilateral  W/chest Right  12/07/2012  *RADIOLOGY REPORT*  Clinical Data: Right lower rib pain, fall  RIGHT RIBS AND CHEST - 3+ VIEW  Comparison: 07/29/2012  Findings: Stable prominence of the right first costochondral junction.  Heart size is normal.  The lungs are clear with the exception of mild crowding of the bronchovascular markings at the bases, likely related to hypoaeration.  No pleural effusion.  No pneumothorax.  There is a nondisplaced fracture of the right posterior eighth rib.  IMPRESSION: Nondisplaced fracture of the right posterior eighth rib.  No pneumothorax or other complicating feature.   Original Report Authenticated By: Christiana Pellant, M.D.      Diagnosis: Rib fracture    MDM  Patient comes to the ER for evaluation of right-sided rib pain after a fall. 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 He landed on his right side and is complaining of right pain. X-ray does confirm a nondisplaced fracture. Patient given instructions for rib fractures including counseling to return for fever or productive cough. Provided analgesia.  She does take Coumadin because of atrial fibrillation. He was, however, no head injury. No concern for intracranial bleeding. No evidence of hemothorax on x-ray.        Gilda Crease, MD 12/07/12 1950

## 2012-12-07 NOTE — ED Notes (Signed)
Pt back from X-ray.  

## 2012-12-07 NOTE — ED Notes (Signed)
MD at bedside. 

## 2012-12-07 NOTE — ED Notes (Signed)
Patient ambulatory to Xray.

## 2012-12-07 NOTE — ED Notes (Signed)
Pt c/o right rib pain after fall on grass x 3 hrs ago

## 2012-12-08 ENCOUNTER — Encounter (HOSPITAL_BASED_OUTPATIENT_CLINIC_OR_DEPARTMENT_OTHER): Payer: Self-pay | Admitting: *Deleted

## 2012-12-08 ENCOUNTER — Emergency Department (HOSPITAL_BASED_OUTPATIENT_CLINIC_OR_DEPARTMENT_OTHER)
Admission: EM | Admit: 2012-12-08 | Discharge: 2012-12-09 | Disposition: A | Discharge status: Home / Self Care | Payer: Self-pay | Attending: Emergency Medicine | Admitting: Emergency Medicine

## 2012-12-08 DIAGNOSIS — S2231XS Fracture of one rib, right side, sequela: Secondary | ICD-10-CM

## 2012-12-08 DIAGNOSIS — Y9389 Activity, other specified: Secondary | ICD-10-CM | POA: Insufficient documentation

## 2012-12-08 DIAGNOSIS — Z8701 Personal history of pneumonia (recurrent): Secondary | ICD-10-CM | POA: Insufficient documentation

## 2012-12-08 DIAGNOSIS — I4891 Unspecified atrial fibrillation: Secondary | ICD-10-CM | POA: Insufficient documentation

## 2012-12-08 DIAGNOSIS — Y990 Civilian activity done for income or pay: Secondary | ICD-10-CM | POA: Insufficient documentation

## 2012-12-08 DIAGNOSIS — F411 Generalized anxiety disorder: Secondary | ICD-10-CM | POA: Insufficient documentation

## 2012-12-08 DIAGNOSIS — Z7901 Long term (current) use of anticoagulants: Secondary | ICD-10-CM | POA: Insufficient documentation

## 2012-12-08 DIAGNOSIS — Z79899 Other long term (current) drug therapy: Secondary | ICD-10-CM | POA: Insufficient documentation

## 2012-12-08 DIAGNOSIS — S2239XA Fracture of one rib, unspecified side, initial encounter for closed fracture: Secondary | ICD-10-CM | POA: Insufficient documentation

## 2012-12-08 DIAGNOSIS — Y9289 Other specified places as the place of occurrence of the external cause: Secondary | ICD-10-CM | POA: Insufficient documentation

## 2012-12-08 DIAGNOSIS — W19XXXA Unspecified fall, initial encounter: Secondary | ICD-10-CM | POA: Insufficient documentation

## 2012-12-08 NOTE — ED Notes (Signed)
Pt states he was seen here last p.m. Dx'd with "cracked rib". "Pain medicine (Vicodin) not working."

## 2012-12-09 ENCOUNTER — Emergency Department (HOSPITAL_BASED_OUTPATIENT_CLINIC_OR_DEPARTMENT_OTHER): Payer: Self-pay

## 2012-12-09 MED ORDER — OXYCODONE HCL ER 10 MG PO T12A: 10.0000 mg | EXTENDED_RELEASE_TABLET | Freq: Two times a day (BID) | ORAL | Status: DC

## 2012-12-09 NOTE — ED Notes (Signed)
Pt given rx x 1 for oxycontin

## 2012-12-09 NOTE — ED Provider Notes (Signed)
History     CSN: [id number]  Arrival date & time 12/08/12  2231   First MD Initiated Contact with Patient 12/09/12 0003      Chief Complaint  Patient presents with  . Follow-up    (Consider location/radiation/quality/duration/timing/severity/associated sxs/prior treatment) HPI Is a 52 year old male who fell at work 2 days ago  developed right lower chest wall pain. He was seen here and diagnosed with a fractured posterior eighth rib. He was discharged home on hydrocodone but states the pain is not being relieved by this. The pain is moderate to severe and worse with movement or deep breathing. He states is not short of breath. He has been using his incentive spirometer.  Past Medical History  Diagnosis Date  . Atrial fibrillation   . Pneumonia   . Anxiety     Past Surgical History  Procedure Laterality Date  . Shoulder surgery    . Ankle surgery    . Wrist surgery      Family History  Problem Relation Age of Onset  . Heart failure Mother   . Stroke Other     History  Substance Use Topics  . Smoking status: Never Smoker   . Smokeless tobacco: Never Used  . Alcohol Use: No      Review of Systems  All other systems reviewed and are negative.    Allergies  Review of patient's allergies indicates no known allergies.  Home Medications   Current Outpatient Rx  Name  Route  Sig  Dispense  Refill  . diltiazem (CARDIZEM CD) 240 MG 24 hr capsule   Oral   Take 240 mg by mouth daily.         . DULoxetine (CYMBALTA) 60 MG capsule   Oral   Take 60 mg by mouth daily.         . furosemide (LASIX) 20 MG tablet   Oral   Take 20 mg by mouth daily.         Marland Kitchen HYDROcodone-acetaminophen (NORCO/VICODIN) 5-325 MG per tablet   Oral   Take 1-2 tablets by mouth every 4 (four) hours as needed for pain.   25 tablet   0   . LORazepam (ATIVAN) 1 MG tablet   Oral   Take 1 tablet (1 mg total) by mouth every 8 (eight) hours as needed for anxiety.   5 tablet   0    . traZODone (DESYREL) 100 MG tablet   Oral   Take 200 mg by mouth at bedtime.         Marland Kitchen warfarin (COUMADIN) 5 MG tablet   Oral   Take 7.5 mg by mouth daily.           BP 115/67  Pulse 83  Temp(Src) 98.1 F (36.7 C) (Oral)  Resp 18  Ht 6\' 3"  (1.905 m)  Wt 265 lb (120.203 kg)  BMI 33.12 kg/m2  SpO2 95%  Physical Exam General: Well-developed, well-nourished male in no acute distress; appearance consistent with age of record HENT: normocephalic, atraumatic Eyes: pupils equal round and reactive to light; extraocular muscles intact Neck: supple Heart: Irregular rhythm Lungs: Decreased breath sounds on the right Chest: Right lower lateral rib tenderness without crepitus Abdomen: soft; nondistended Extremities: No deformity; full range of motion Neurologic: Awake, alert and oriented; motor function intact in all extremities and symmetric; no facial droop Skin: Warm and dry Psychiatric: Appears uncomfortable    ED Course  Procedures (including critical care time)    MDM  Nursing notes and vitals signs, including pulse oximetry, reviewed.  Summary of this visit's results, reviewed by myself:  Labs:  No results found for this or any previous visit (from the past 24 hour(s)).  Imaging Studies: Dg Chest 2 View  12/09/2012  *RADIOLOGY REPORT*  Clinical Data:  Right eighth rib fracture, increased pain, follow- up  CHEST - 2 VIEW  Comparison: 12/07/2012  Findings: Upper normal heart size. Mediastinal contours and pulmonary vascularity normal. Peribronchial thickening without infiltrate, pleural effusion or pneumothorax. Fracture of the right eighth rib seen on prior rib radiographs is not identified on chest radiograph.  IMPRESSION: No acute abnormalities.   Original Report Authenticated By: Ulyses Southward, M.D.    Dg Ribs Unilateral W/chest Right  12/07/2012  *RADIOLOGY REPORT*  Clinical Data: Right lower rib pain, fall  RIGHT RIBS AND CHEST - 3+ VIEW  Comparison: 07/29/2012   Findings: Stable prominence of the right first costochondral junction.  Heart size is normal.  The lungs are clear with the exception of mild crowding of the bronchovascular markings at the bases, likely related to hypoaeration.  No pleural effusion.  No pneumothorax.  There is a nondisplaced fracture of the right posterior eighth rib.  IMPRESSION: Nondisplaced fracture of the right posterior eighth rib.  No pneumothorax or other complicating feature.   Original Report Authenticated By: Christiana Pellant, M.D.    Will add Oxycontin to pain regimen.           Hanley Seamen, MD 12/09/12 [phone]

## 2012-12-09 NOTE — ED Notes (Signed)
MD at bedside. 

## 2012-12-17 ENCOUNTER — Emergency Department (HOSPITAL_BASED_OUTPATIENT_CLINIC_OR_DEPARTMENT_OTHER)
Admission: EM | Admit: 2012-12-17 | Discharge: 2012-12-17 | Disposition: A | Discharge status: Home / Self Care | Payer: Self-pay | Attending: Emergency Medicine | Admitting: Emergency Medicine

## 2012-12-17 ENCOUNTER — Encounter (HOSPITAL_BASED_OUTPATIENT_CLINIC_OR_DEPARTMENT_OTHER): Payer: Self-pay | Admitting: *Deleted

## 2012-12-17 DIAGNOSIS — Z7901 Long term (current) use of anticoagulants: Secondary | ICD-10-CM | POA: Insufficient documentation

## 2012-12-17 DIAGNOSIS — S2239XA Fracture of one rib, unspecified side, initial encounter for closed fracture: Secondary | ICD-10-CM | POA: Insufficient documentation

## 2012-12-17 DIAGNOSIS — Z8701 Personal history of pneumonia (recurrent): Secondary | ICD-10-CM | POA: Insufficient documentation

## 2012-12-17 DIAGNOSIS — F411 Generalized anxiety disorder: Secondary | ICD-10-CM | POA: Insufficient documentation

## 2012-12-17 DIAGNOSIS — Y9289 Other specified places as the place of occurrence of the external cause: Secondary | ICD-10-CM | POA: Insufficient documentation

## 2012-12-17 DIAGNOSIS — I4891 Unspecified atrial fibrillation: Secondary | ICD-10-CM | POA: Insufficient documentation

## 2012-12-17 DIAGNOSIS — Y9389 Activity, other specified: Secondary | ICD-10-CM | POA: Insufficient documentation

## 2012-12-17 DIAGNOSIS — W010XXA Fall on same level from slipping, tripping and stumbling without subsequent striking against object, initial encounter: Secondary | ICD-10-CM | POA: Insufficient documentation

## 2012-12-17 DIAGNOSIS — S2231XS Fracture of one rib, right side, sequela: Secondary | ICD-10-CM

## 2012-12-17 DIAGNOSIS — Z79899 Other long term (current) drug therapy: Secondary | ICD-10-CM | POA: Insufficient documentation

## 2012-12-17 MED ORDER — IBUPROFEN 400 MG PO TABS
600.0000 mg | ORAL_TABLET | Freq: Once | ORAL | Status: AC
  Administered 2012-12-17: 600 mg via ORAL
  Filled 2012-12-17: qty 1

## 2012-12-17 NOTE — ED Provider Notes (Signed)
History     CSN: [id number]  Arrival date & time 12/17/12  1015   First MD Initiated Contact with Patient 12/17/12 1021      Chief Complaint  Patient presents with  . out of meds      Patient is a 52 y.o. male presenting with chest pain. The history is provided by the patient.  Chest Pain Pain location:  R chest Pain quality: aching   Pain radiates to:  Does not radiate Pain severity:  Moderate Duration:  10 days Timing:  Constant Chronicity:  Chronic Context comment:  Fall Relieved by:  Nothing Worsened by:  Certain positions and deep breathing Ineffective treatments: oxycontin. Associated symptoms: no abdominal pain, no back pain, no cough, no diaphoresis, no dizziness, no fatigue, no fever, no nausea, no near-syncope, no shortness of breath, no syncope, not vomiting and no weakness     Past Medical History  Diagnosis Date  . Atrial fibrillation   . Pneumonia   . Anxiety     Past Surgical History  Procedure Laterality Date  . Shoulder surgery    . Ankle surgery    . Wrist surgery      Family History  Problem Relation Age of Onset  . Heart failure Mother   . Stroke Other     History  Substance Use Topics  . Smoking status: Never Smoker   . Smokeless tobacco: Never Used  . Alcohol Use: No      Review of Systems  Constitutional: Negative for fever, diaphoresis and fatigue.  Respiratory: Negative for cough and shortness of breath.   Cardiovascular: Positive for chest pain. Negative for syncope and near-syncope.  Gastrointestinal: Negative for nausea, vomiting and abdominal pain.  Musculoskeletal: Negative for back pain.  Neurological: Negative for dizziness and weakness.    Allergies  Review of patient's allergies indicates no known allergies.  Home Medications   Current Outpatient Rx  Name  Route  Sig  Dispense  Refill  . diltiazem (CARDIZEM CD) 240 MG 24 hr capsule   Oral   Take 240 mg by mouth daily.         . DULoxetine (CYMBALTA) 60  MG capsule   Oral   Take 60 mg by mouth daily.         . furosemide (LASIX) 20 MG tablet   Oral   Take 20 mg by mouth daily.         Marland Kitchen HYDROcodone-acetaminophen (NORCO/VICODIN) 5-325 MG per tablet   Oral   Take 1-2 tablets by mouth every 4 (four) hours as needed for pain.   25 tablet   0   . LORazepam (ATIVAN) 1 MG tablet   Oral   Take 1 tablet (1 mg total) by mouth every 8 (eight) hours as needed for anxiety.   5 tablet   0   . OxyCODONE (OXYCONTIN) 10 mg T12A   Oral   Take 1 tablet (10 mg total) by mouth every 12 (twelve) hours.   30 tablet   0   . traZODone (DESYREL) 100 MG tablet   Oral   Take 200 mg by mouth at bedtime.         Marland Kitchen warfarin (COUMADIN) 5 MG tablet   Oral   Take 7.5 mg by mouth daily.           BP 178/102  Pulse 84  Temp(Src) 98.4 F (36.9 C) (Oral)  Resp 18  Ht 6\' 3"  (1.905 m)  Wt 265 lb (120.203 kg)  BMI 33.12 kg/m2  SpO2 97%  Physical Exam CONSTITUTIONAL: Well developed/well nourished HEAD: Normocephalic/atraumatic EYES: EOMI/PERRL ENMT: Mucous membranes moist NECK: supple no meningeal signs SPINE:entire spine nontender CV:  no murmurs/rubs/gallops noted Chest - no brusing/crepitance noetd.  Tenderness along right lower rib margin.    LUNGS: Lungs are clear to auscultation bilaterally, no apparent distress ABDOMEN: soft, nontender, no rebound or guarding.  No bruising/tenderness to right flank/RUQ GU:no cva tenderness NEURO: Pt is awake/alert, moves all extremitiesx4 EXTREMITIES: pulses normal, full ROM SKIN: warm, color normal PSYCH: no abnormalities of mood noted  ED Course  Procedures  1. Rib fracture, right, sequela       MDM  Nursing notes including past medical history and social history reviewed and considered in documentation Previous records reviewed and considered - previous xray reviewed.  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  I feel he is safe for short course of ibuprofen (2-3 days)  I advised him that since he has been given oxycontin/hydrocodone would not feel comfortable re-prescribing narcotics (he admits oxycontin didn't work anyway)          Joya Gaskins, MD 12/17/12 1155

## 2012-12-17 NOTE — ED Notes (Signed)
States seen recently diagnosed with broken rib and is out of his pain meds has no primary care MD so returned here to see about getting meds refilled

## 2013-08-18 ENCOUNTER — Encounter (HOSPITAL_COMMUNITY): Payer: Self-pay | Admitting: Emergency Medicine

## 2013-08-18 ENCOUNTER — Emergency Department (HOSPITAL_COMMUNITY)
Admission: EM | Admit: 2013-08-18 | Discharge: 2013-08-18 | Disposition: A | Discharge status: Home / Self Care | Payer: 59 | Attending: Emergency Medicine | Admitting: Emergency Medicine

## 2013-08-18 ENCOUNTER — Emergency Department (HOSPITAL_COMMUNITY): Payer: 59

## 2013-08-18 DIAGNOSIS — I4891 Unspecified atrial fibrillation: Secondary | ICD-10-CM | POA: Insufficient documentation

## 2013-08-18 DIAGNOSIS — Z7901 Long term (current) use of anticoagulants: Secondary | ICD-10-CM | POA: Insufficient documentation

## 2013-08-18 DIAGNOSIS — R0609 Other forms of dyspnea: Secondary | ICD-10-CM | POA: Insufficient documentation

## 2013-08-18 DIAGNOSIS — R609 Edema, unspecified: Secondary | ICD-10-CM | POA: Insufficient documentation

## 2013-08-18 DIAGNOSIS — Z8701 Personal history of pneumonia (recurrent): Secondary | ICD-10-CM | POA: Insufficient documentation

## 2013-08-18 DIAGNOSIS — R0600 Dyspnea, unspecified: Secondary | ICD-10-CM

## 2013-08-18 DIAGNOSIS — R0989 Other specified symptoms and signs involving the circulatory and respiratory systems: Principal | ICD-10-CM | POA: Insufficient documentation

## 2013-08-18 DIAGNOSIS — Z79899 Other long term (current) drug therapy: Secondary | ICD-10-CM | POA: Insufficient documentation

## 2013-08-18 DIAGNOSIS — I2510 Atherosclerotic heart disease of native coronary artery without angina pectoris: Secondary | ICD-10-CM | POA: Insufficient documentation

## 2013-08-18 DIAGNOSIS — F411 Generalized anxiety disorder: Secondary | ICD-10-CM | POA: Insufficient documentation

## 2013-08-18 LAB — CBC WITH DIFFERENTIAL/PLATELET
BASOS ABS: 0 10*3/uL (ref 0.0–0.1)
BASOS PCT: 1 % (ref 0–1)
EOS ABS: 0.2 10*3/uL (ref 0.0–0.7)
EOS PCT: 3 % (ref 0–5)
HEMATOCRIT: 43.4 % (ref 39.0–52.0)
Hemoglobin: 14.9 g/dL (ref 13.0–17.0)
Lymphocytes Relative: 41 % (ref 12–46)
Lymphs Abs: 2.5 10*3/uL (ref 0.7–4.0)
MCH: 30.9 pg (ref 26.0–34.0)
MCHC: 34.3 g/dL (ref 30.0–36.0)
MCV: 90 fL (ref 78.0–100.0)
MONO ABS: 0.6 10*3/uL (ref 0.1–1.0)
Monocytes Relative: 10 % (ref 3–12)
Neutro Abs: 2.7 10*3/uL (ref 1.7–7.7)
Neutrophils Relative %: 45 % (ref 43–77)
PLATELETS: 202 10*3/uL (ref 150–400)
RBC: 4.82 MIL/uL (ref 4.22–5.81)
RDW: 12.5 % (ref 11.5–15.5)
WBC: 6 10*3/uL (ref 4.0–10.5)

## 2013-08-18 LAB — BASIC METABOLIC PANEL
BUN: 14 mg/dL (ref 6–23)
CALCIUM: 8.7 mg/dL (ref 8.4–10.5)
CO2: 24 mEq/L (ref 19–32)
CREATININE: 0.73 mg/dL (ref 0.50–1.35)
Chloride: 103 mEq/L (ref 96–112)
Glucose, Bld: 114 mg/dL — ABNORMAL HIGH (ref 70–99)
Potassium: 4.1 mEq/L (ref 3.7–5.3)
SODIUM: 141 meq/L (ref 137–147)

## 2013-08-18 LAB — PRO B NATRIURETIC PEPTIDE: PRO B NATRI PEPTIDE: 70.9 pg/mL (ref 0–125)

## 2013-08-18 LAB — TROPONIN I: Troponin I: <0.3 ng/mL (ref <0.30)

## 2013-08-18 MED ORDER — FUROSEMIDE 20 MG PO TABS
20.0000 mg | ORAL_TABLET | Freq: Once | ORAL | Status: AC
  Administered 2013-08-18: 20 mg via ORAL
  Filled 2013-08-18: qty 1

## 2013-08-18 MED ORDER — ASPIRIN 81 MG PO CHEW
162.0000 mg | CHEWABLE_TABLET | Freq: Once | ORAL | Status: AC
  Administered 2013-08-18: 162 mg via ORAL
  Filled 2013-08-18: qty 2

## 2013-08-18 NOTE — ED Notes (Signed)
The pt is c/o soi for 2 hours unable to sleep flat.  No pain

## 2013-08-18 NOTE — ED Notes (Signed)
Belongings with pt when he discharged. No  Belongings left in room.

## 2013-08-18 NOTE — ED Provider Notes (Signed)
CSN: [id number]     Arrival date & time 08/18/13  0404 History   First MD Initiated Contact with Patient 08/18/13 0407     Chief Complaint  Patient presents with  . Shortness of Breath   (Consider location/radiation/quality/duration/timing/severity/associated sxs/prior Treatment) HPI Comments: Pt comes in with cc of dib. Pt has hx of afib, CAD (no coronary intervention), ?CHF - on lasix comes in with cc of dib. Pt reports that for the past 2 weeks he has been having dib, especially when he lays supine. Tonight, his sx are worse, so he comes to the ED. No chest pains, and no exertional dib. Pt denies any hx of PE, DVT and no risk factors for the same. Pt has no new swelling to his legs.  Patient is a 53 y.o. male presenting with shortness of breath. The history is provided by the patient.  Shortness of Breath Associated symptoms: no abdominal pain, no chest pain, no cough and no wheezing     Past Medical History  Diagnosis Date  . Atrial fibrillation   . Pneumonia   . Anxiety   . Coronary artery disease    Past Surgical History  Procedure Laterality Date  . Shoulder surgery    . Ankle surgery    . Wrist surgery     Family History  Problem Relation Age of Onset  . Heart failure Mother   . Stroke Other    History  Substance Use Topics  . Smoking status: Never Smoker   . Smokeless tobacco: Never Used  . Alcohol Use: No    Review of Systems  Constitutional: Negative for activity change and appetite change.  Respiratory: Positive for shortness of breath. Negative for cough and wheezing.   Cardiovascular: Negative for chest pain.  Gastrointestinal: Negative for abdominal pain.  Genitourinary: Negative for dysuria.  Hematological: Does not bruise/bleed easily.    Allergies  Review of patient's allergies indicates no known allergies.  Home Medications   Current Outpatient Rx  Name  Route  Sig  Dispense  Refill  . furosemide (LASIX) 20 MG tablet   Oral   Take 20 mg by  mouth every other day.          . traZODone (DESYREL) 100 MG tablet   Oral   Take 100 mg by mouth at bedtime as needed for sleep.          Marland Kitchen diltiazem (CARDIZEM CD) 240 MG 24 hr capsule   Oral   Take 240 mg by mouth daily.         Marland Kitchen warfarin (COUMADIN) 5 MG tablet   Oral   Take 7.5 mg by mouth daily.          BP 129/73  Pulse 87  Temp(Src) 98 F (36.7 C) (Oral)  Resp 14  SpO2 96% Physical Exam  Nursing note and vitals reviewed. Constitutional: He is oriented to person, place, and time. He appears well-developed.  HENT:  Head: Normocephalic and atraumatic.  Eyes: Conjunctivae and EOM are normal. Pupils are equal, round, and reactive to light.  Neck: Normal range of motion. Neck supple.  Cardiovascular: Normal rate and regular rhythm.   No murmur heard. Pulmonary/Chest: Effort normal and breath sounds normal. No respiratory distress. He has no wheezes. He has no rales. He exhibits no tenderness.  Abdominal: Soft. Bowel sounds are normal. He exhibits no distension. There is no tenderness. There is no rebound and no guarding.  Musculoskeletal: He exhibits edema.  Neurological: He is alert  and oriented to person, place, and time.  Skin: Skin is warm.    ED Course  Procedures (including critical care time) Labs Review Labs Reviewed  CBC WITH DIFFERENTIAL  BASIC METABOLIC PANEL  PRO B NATRIURETIC PEPTIDE  TROPONIN I   Imaging Review No results found.  EKG Interpretation    Date/Time:  Sunday August 18 2013 04:06:18 EST Ventricular Rate:  80 PR Interval:  160 QRS Duration: 86 QT Interval:  388 QTC Calculation: 447 R Axis:   16 Text Interpretation:  Normal sinus rhythm Normal ECG Confirmed by Tyrell Seifer, MD, Keldan Eplin ([id number]) on 08/18/2013 5:17:51 AM            MDM  No diagnosis found.  Pt comes in with cc of dib. Pt's lung exam is pretty benign - however, he has been having some orthopnea and PND over the past few nights.  DDX: CHF Pulm  edema Pleural effusion Pericardial effusion PE  Will get basic labs, BNP and CXR. EKG shows good voltage, which is reassuring.  Varney Biles, MD 08/18/13 [phone]

## 2013-08-18 NOTE — Discharge Instructions (Signed)
We saw you in the ER for the shortness of breath. All the results in the ER are normal.  We are not sure what is causing your symptoms. The workup in the ER is not complete, and is limited to screening for life threatening and emergent conditions only, so please see a primary care doctor for further evaluation.   RESOURCE GUIDE  Chronic Pain Problems: Contact [facility]  [phone] Patients need to be referred by their primary care doctor.  Insufficient Money for Medicine: Contact [employer]:  call "211."   No Primary Care Doctor: - Call Health Connect  [phone] - can help you locate a primary care doctor that  accepts your insurance, provides certain services, etc. - Physician Referral Service- [phone]  Agencies that provide inexpensive medical care: - [facility]  [facility]  [phone] - [facility]  [phone] - [facility]  [phone] - [facility]  [facility]  [phone]  [place] Providers: - [facility]- [address], [address]  [phone], Mon-Fri 9am-7pm, Sat 9am-1pm - [facility]- [place], [address] [place]  [place], [address] [place]  [place]- [address]  [place], [address], [phone]  Only accepts [place] patients after they have their name  applied to their card  Self Pay (no insurance) in [place]: - Sickle Cell Patients: Dr Kevan Ny, [facility]  [place], [facility]- [place]  [facility] [place]- [address], [facility]- see information above (Speak to [employer] if you do not have insurance)       -   [facility]- [place],  [place], [place]  Dr Vista Lawman-  [address], [place], [place], [place]       -  [facility] - [address] [phone]       -  [facility]- [address], [place], also [address] [phone]       -    [facility]- [place], [place], 1st & 3rd Saturday        every month, 10am-1pm  [facility] [place], [place] [address] [phone]  [facility]. [place],  [phone]  1) Find a Doctor and Pay Out of Pocket Although you won't have to find out who is covered by your insurance plan, it is a good idea to ask around and get recommendations. You will then need to call the office and see if the doctor you have chosen will accept you as a new patient and what types of options they offer for patients who are self-pay. Some doctors offer discounts or will set up payment plans for their patients who do not have insurance, but you will need to ask so you aren't surprised when you  get to your appointment.  2) Contact Your Local Health Department Not all health departments have doctors that can see patients for sick visits, but many do, so it is worth a call to see if yours does. If you don't know where your local health department is, you can check in your phone book. The CDC also has a tool to help you locate your state's health department, and many state websites also have listings of all of their local health departments.  3) Find a [facility] If your illness is not likely to be very severe or complicated, you may want to try a walk in clinic. These are popping up all over the country in pharmacies, drugstores, and shopping centers. They're usually staffed by nurse practitioners or physician assistants that have been  trained to treat common illnesses and complaints. They're usually fairly quick and inexpensive. However, if you have serious medical issues or chronic medical problems, these are probably not your best option  STD Testing - [place], [facility], [address] [place], phone [phone] or [phone].  Monday - Friday, call for an appointment. - [place], [facility], [address], [place], phone [phone] or [phone].  Monday - Friday, call for an appointment.  Abuse/Neglect: - [place] [phone] - [place] [phone] (After Hours)  Emergency Shelter:  [facility] [phone]  Maternity Homes: - [facility] [place] [phone] - [place] [phone]  MRSA Hotline #:   (774)059-7858  Dental Assistance If unable to pay or uninsured, contact:  [facility]. to become qualified for the adult dental clinic.  Patients with Medicaid: [facility] [phone], [place] [address], [phone]  If unable to pay, or uninsured, contact [facility] [phone] in [place], [place] in [facility]) to become qualified for the adult dental clinic  [facility] [address] [place], [place] [address] [phone] [email or web address]  Other [place]: - [facility]- [place], [place], [place], [address], [place], 2nd and 4th Thursday of the month at 6:30am.  10 clients each day by appointment, can sometimes see walk-in patients if someone does not show for an appointment. [facility]- [address] [place], [place], [address], [place], [place], [place], [address], [place] Department- [place] [phone]          Shortness of Breath Shortness of breath means you have trouble breathing. Shortness of breath may indicate that you have a medical problem. You should seek immediate medical care for shortness of breath. CAUSES   Not enough oxygen in the air (as with high altitudes or a smoke-filled room).  Short-term (acute) lung disease, including:  Infections, such as pneumonia.  Fluid in the lungs, such as heart failure.  A blood clot in the lungs (pulmonary embolism).  Long-term (chronic) lung diseases.  Heart disease (heart attack, angina, heart failure, and others).  Low red blood cells (anemia).  Poor physical fitness. This can cause shortness of breath when you exercise.  Chest or back injuries or stiffness.  Being overweight.  Smoking.  Anxiety. This can make you feel like you are not getting  enough air. DIAGNOSIS  Serious medical problems can usually be found during your physical exam. Tests may also be done to determine why you are having shortness of breath. Tests may include:  Chest X-rays.  Lung function tests.  Blood tests.  Electrocardiography.  Exercise testing.  Echocardiography.  Imaging scans. Your caregiver may not be able to find a cause for your shortness of breath after your exam. In this case, it is important to have a follow-up exam with your caregiver as directed.  TREATMENT  Treatment for shortness of breath depends on the cause of your symptoms and can vary greatly. HOME CARE INSTRUCTIONS   Do not smoke. Smoking is a common cause of shortness of breath. If you smoke, ask for help to quit.  Avoid being around chemicals or things that may bother your breathing, such as paint fumes and dust.  Rest as needed. Slowly resume your usual activities.  If medicines were prescribed, take them as directed for the full  length of time directed. This includes oxygen and any inhaled medicines.  Keep all follow-up appointments as directed by your caregiver. SEEK MEDICAL CARE IF:   Your condition does not improve in the time expected.  You have a hard time doing your normal activities even with rest.  You have any side effects or problems with the medicines prescribed.  You develop any new symptoms. SEEK IMMEDIATE MEDICAL CARE IF:   Your shortness of breath gets worse.  You feel lightheaded, faint, or develop a cough not controlled with medicines.  You start coughing up blood.  You have pain with breathing.  You have chest pain or pain in your arms, shoulders, or abdomen.  You have a fever.  You are unable to walk up stairs or exercise the way you normally do. MAKE SURE YOU:  Understand these instructions.  Will watch your condition.  Will get help right away if you are not doing well or get worse. Document Released: 04/12/2001 Document Revised: 01/17/2012 Document Reviewed: 10/03/2011 [facility] Patient Information 2014 [employer].

## 2013-08-18 NOTE — ED Notes (Signed)
Pt stated that he has been having SOB x few hours. SOB worse when he lays back. Dry cough for 3-4 months. No chest pain or n/v. No fevers. Pt complaining of right side back pain intermittentl. 6 months ago had broken ribs on right side. No cardiac or respiratory distress at this time.

## 2013-10-18 ENCOUNTER — Encounter (HOSPITAL_COMMUNITY): Payer: Self-pay | Admitting: Emergency Medicine

## 2013-10-18 DIAGNOSIS — Z8701 Personal history of pneumonia (recurrent): Secondary | ICD-10-CM | POA: Insufficient documentation

## 2013-10-18 DIAGNOSIS — Z7901 Long term (current) use of anticoagulants: Secondary | ICD-10-CM | POA: Insufficient documentation

## 2013-10-18 DIAGNOSIS — F411 Generalized anxiety disorder: Secondary | ICD-10-CM | POA: Insufficient documentation

## 2013-10-18 DIAGNOSIS — R079 Chest pain, unspecified: Secondary | ICD-10-CM | POA: Insufficient documentation

## 2013-10-18 DIAGNOSIS — I4891 Unspecified atrial fibrillation: Secondary | ICD-10-CM | POA: Insufficient documentation

## 2013-10-18 DIAGNOSIS — G8911 Acute pain due to trauma: Secondary | ICD-10-CM | POA: Insufficient documentation

## 2013-10-18 DIAGNOSIS — Z79899 Other long term (current) drug therapy: Secondary | ICD-10-CM | POA: Insufficient documentation

## 2013-10-18 DIAGNOSIS — I2510 Atherosclerotic heart disease of native coronary artery without angina pectoris: Secondary | ICD-10-CM | POA: Insufficient documentation

## 2013-10-18 NOTE — ED Notes (Signed)
Pt states he tripped and fell this afternoon.  C/o R sided rib pain.  Denies LOC.  Seen at an orthopedic [facility] and was told to come to ED for a "rectangle area" that showed on his x-rays.

## 2013-10-19 ENCOUNTER — Emergency Department (HOSPITAL_COMMUNITY): Payer: 59

## 2013-10-19 ENCOUNTER — Emergency Department (HOSPITAL_COMMUNITY)
Admission: EM | Admit: 2013-10-19 | Discharge: 2013-10-19 | Disposition: A | Discharge status: Home / Self Care | Payer: 59 | Attending: Emergency Medicine | Admitting: Emergency Medicine

## 2013-10-19 DIAGNOSIS — R0781 Pleurodynia: Secondary | ICD-10-CM

## 2013-10-19 MED ORDER — KETOROLAC TROMETHAMINE 60 MG/2ML IM SOLN
60.0000 mg | Freq: Once | INTRAMUSCULAR | Status: DC
  Administered 2013-10-19: 60 mg via INTRAMUSCULAR
  Filled 2013-10-19: qty 2

## 2013-10-19 MED ORDER — MORPHINE SULFATE 4 MG/ML IJ SOLN: 4.0000 mg | Freq: Once | INTRAMUSCULAR | Status: DC

## 2013-10-19 MED ORDER — ACETAMINOPHEN-CODEINE #3 300-30 MG PO TABS: 1.0000 | ORAL_TABLET | Freq: Four times a day (QID) | ORAL | Status: DC | PRN

## 2013-10-19 MED ORDER — OXYCODONE-ACETAMINOPHEN 5-325 MG PO TABS
1.0000 | ORAL_TABLET | Freq: Once | ORAL | Status: AC
  Administered 2013-10-19: 1 via ORAL
  Filled 2013-10-19: qty 1

## 2013-10-19 NOTE — Discharge Instructions (Signed)
Please follow up with your primary care physician in 1-2 days. If you do not have one please call the [place] number listed above. Please follow up with the orthopedist to schedule a follow up appointment. Please take pain medication and/or muscle relaxants as prescribed and as needed for pain. Please do not drive on narcotic pain medication or on muscle relaxants. Please read all discharge instructions and return precautions.   Rib Contusion A rib contusion (bruise) can occur by a blow to the chest or by a fall against a hard object. Usually these will be much better in a couple weeks. If X-rays were taken today and there are no broken bones (fractures), the diagnosis of bruising is made. However, broken ribs may not show up for several days, or may be discovered later on a routine X-ray when signs of healing show up. If this happens to you, it does not mean that something was missed on the X-ray, but simply that it did not show up on the first X-rays. Earlier diagnosis will not usually change the treatment. HOME CARE INSTRUCTIONS   Avoid strenuous activity. Be careful during activities and avoid bumping the injured ribs. Activities that pull on the injured ribs and cause pain should be avoided, if possible.  For the first day or two, an ice pack used every 20 minutes while awake may be helpful. Put ice in a plastic bag and put a towel between the bag and the skin.  Eat a normal, well-balanced diet. Drink plenty of fluids to avoid constipation.  Take deep breaths several times a day to keep lungs free of infection. Try to cough several times a day. Splint the injured area with a pillow while coughing to ease pain. Coughing can help prevent pneumonia.  Wear a rib belt or binder only if told to do so by your caregiver. If you are wearing a rib belt or binder, you must do the breathing exercises as directed by your caregiver. If not used properly, rib belts or binders restrict  breathing which can lead to pneumonia.  Only take over-the-counter or prescription medicines for pain, discomfort, or fever as directed by your caregiver. SEEK MEDICAL CARE IF:   You or your child has an oral temperature above 102 F (38.9 C).  Your baby is older than 3 months with a rectal temperature of 100.5 F (38.1 C) or higher for more than 1 day.  You develop a cough, with thick or bloody sputum. SEEK IMMEDIATE MEDICAL CARE IF:   You have difficulty breathing.  You feel sick to your stomach (nausea), have vomiting or belly (abdominal) pain.  You have worsening pain, not controlled with medications, or there is a change in the location of the pain.  You develop sweating or radiation of the pain into the arms, jaw or shoulders, or become light headed or faint.  You or your child has an oral temperature above 102 F (38.9 C), not controlled by medicine.  Your or your baby is older than 3 months with a rectal temperature of 102 F (38.9 C) or higher.  Your baby is 27 months old or younger with a rectal temperature of 100.4 F (38 C) or higher. MAKE SURE YOU:   Understand these instructions.  Will watch your condition.  Will get help right away if you are not doing well or get worse. Document Released: 04/12/2001 Document Revised: 11/12/2012 Document Reviewed: 03/05/2008 [facility] Patient Information 2014 [place].

## 2013-10-19 NOTE — ED Provider Notes (Signed)
CSN: [id number]     Arrival date & time 10/18/13  2147 History   None    Chief Complaint  Patient presents with  . Fall  . rib pain      (Consider location/radiation/quality/duration/timing/severity/associated sxs/prior Treatment) HPI Comments: Patient is a 53 yo M PMHx significant for A fib, Anxiety, CAD presenting to the ED for right sided rib pain after tripping and falling while at work this evening around 5PM. He states he landed on his ribs and had immediate pain. He denies hitting his head or losing consciousness. Patient states he was seen at an [facility] and sent over to the ER for evaluation of a "rectangle area of black" on his CXR. He states he has not had anything for his pain, despite being given an oxycodone while in ER. He states "they didn't want to give me anything at the [facility] just in case." He states his pain is worsened with deep inspiration and palpation. No alleviating factors. No central CP, SOB at rest, fevers, chills, nausea, vomiting, diaphoresis.   Patient is a 53 y.o. male presenting with fall.  Fall Associated symptoms include chest pain (lower right sided ). Pertinent negatives include no chills, coughing or fever.    Past Medical History  Diagnosis Date  . Atrial fibrillation   . Pneumonia   . Anxiety   . Coronary artery disease    Past Surgical History  Procedure Laterality Date  . Shoulder surgery    . Ankle surgery    . Wrist surgery     Family History  Problem Relation Age of Onset  . Heart failure Mother   . Stroke Other    History  Substance Use Topics  . Smoking status: Never Smoker   . Smokeless tobacco: Never Used  . Alcohol Use: No    Review of Systems  Constitutional: Negative for fever and chills.  Respiratory: Negative for cough, shortness of breath, wheezing and stridor.   Cardiovascular: Positive for chest pain (lower right sided ).  All other systems reviewed and are negative.      Allergies  Review of patient's allergies  indicates no known allergies.  Home Medications   Current Outpatient Rx  Name  Route  Sig  Dispense  Refill  . diltiazem (CARDIZEM CD) 240 MG 24 hr capsule   Oral   Take 240 mg by mouth daily.         Marland Kitchen diltiazem (CARDIZEM) 60 MG tablet   Oral   Take 60 mg by mouth as needed (take when heart rate is atrial fib).         . furosemide (LASIX) 20 MG tablet   Oral   Take 20 mg by mouth every other day.          . warfarin (COUMADIN) 5 MG tablet   Oral   Take 7.5 mg by mouth daily.         Marland Kitchen acetaminophen-codeine (TYLENOL #3) 300-30 MG per tablet   Oral   Take 1-2 tablets by mouth every 6 (six) hours as needed for moderate pain.   15 tablet   0   . traZODone (DESYREL) 100 MG tablet   Oral   Take 100 mg by mouth at bedtime as needed for sleep.           BP 112/61  Pulse 59  Temp(Src) 97.9 F (36.6 C) (Oral)  Resp 18  Ht 6' (1.829 m)  Wt 305 lb (138.347 kg)  BMI 41.36 kg/m2  SpO2 96% Physical Exam  Nursing note and vitals reviewed. Constitutional: He is oriented to person, place, and time. He appears well-developed and well-nourished. No distress.  HENT:  Head: Normocephalic and atraumatic.  Right Ear: External ear normal.  Left Ear: External ear normal.  Nose: Nose normal.  Mouth/Throat: Oropharynx is clear and moist.  Eyes: Conjunctivae are normal.  Neck: Normal range of motion. Neck supple.  Cardiovascular: Normal rate, regular rhythm, normal heart sounds and intact distal pulses.   Pulmonary/Chest: Effort normal and breath sounds normal. No respiratory distress. He has no decreased breath sounds. He exhibits tenderness. He exhibits no mass, no bony tenderness, no laceration, no edema, no deformity and no swelling.    Abdominal: Soft. There is no tenderness.  Neurological: He is alert and oriented to person, place, and time.  Skin: Skin is warm and dry. He is not diaphoretic.  No bruising     ED Course  Procedures (including critical care  time) Medications  oxyCODONE-acetaminophen (PERCOCET/ROXICET) 5-325 MG per tablet 1 tablet (1 tablet Oral Given 10/19/13 0156)    Labs Review Labs Reviewed - No data to display Imaging Review Dg Ribs Unilateral W/chest Right  10/19/2013   CLINICAL DATA:  [place] approximately 12 hr ago, landing on his right side. Lower right rib pain.  EXAM: RIGHT RIBS AND CHEST - 3+ VIEW  COMPARISON:  DG CHEST 2 VIEW dated 08/18/2013; DG CHEST 2 VIEW dated 12/09/2012; DG RIBS UNILATERAL W/CHEST*R* dated 12/07/2012; DG CHEST 2 VIEW dated 07/29/2012  FINDINGS: Old healed fracture involving the right posterolateral eighth rib. No acute right rib fractures.  Cardiomediastinal silhouette unremarkable and unchanged. Lungs clear. Bronchovascular markings normal. Pulmonary vascularity normal. No visible pleural effusions. No pneumothorax. Stable eventration of the hemidiaphragms.  IMPRESSION: 1. No acute right rib fractures identified. Old healed fracture involving the right eighth rib. 2.  No acute cardiopulmonary disease.   Electronically Signed   By: Evangeline Dakin M.D.   On: 10/19/2013 04:56     EKG Interpretation None      MDM   Final diagnoses:  Rib pain on right side    Filed Vitals:   10/19/13 0559  BP: 112/61  Pulse: 59  Temp:   Resp: 18    Afebrile, NAD, non-toxic appearing, AAOx4. Patient presenting with right rib pain after fall. Ribs TTP. No bruising, no obvious deformity. No crepitus. No signs of respiratory distress. No tachypnea. No accessory muscle use. No flail chest. Lungs CTA bilaterally. RRR nml S1S2. CXR reviewed no acute abnormality noted. Pain managed in ED. advised PCP and/or orthopedic followup. Return precautions discussed. Patient agreeable to plan. Patient stable at time of discharge. Patient d/w with Dr. Marnette Burgess, agrees with plan.        Harlow Mares, PA-C 10/19/13 [phone]

## 2013-10-19 NOTE — ED Provider Notes (Signed)
Medical screening examination/treatment/procedure(s) were performed by non-physician practitioner and as supervising physician I was immediately available for consultation/collaboration.   EKG Interpretation None       Teressa Lower, MD 10/19/13 [phone]

## 2013-12-11 ENCOUNTER — Other Ambulatory Visit: Payer: Self-pay | Admitting: Sports Medicine

## 2013-12-11 DIAGNOSIS — M545 Low back pain, unspecified: Secondary | ICD-10-CM

## 2013-12-12 ENCOUNTER — Ambulatory Visit
Admission: RE | Admit: 2013-12-12 | Discharge: 2013-12-12 | Disposition: A | Discharge status: Home / Self Care | Payer: 59 | Source: Ambulatory Visit | Attending: Sports Medicine | Admitting: Sports Medicine

## 2013-12-12 DIAGNOSIS — M545 Low back pain, unspecified: Secondary | ICD-10-CM

## 2013-12-31 ENCOUNTER — Encounter: Payer: Self-pay | Admitting: Interventional Cardiology

## 2014-01-07 ENCOUNTER — Encounter: Payer: Self-pay | Admitting: Interventional Cardiology

## 2014-01-07 ENCOUNTER — Ambulatory Visit (INDEPENDENT_AMBULATORY_CARE_PROVIDER_SITE_OTHER): Payer: 59 | Admitting: Interventional Cardiology

## 2014-01-07 VITALS — BP 132/85 | HR 92 | Ht 72.0 in | Wt 289.6 lb

## 2014-01-07 DIAGNOSIS — E669 Obesity, unspecified: Secondary | ICD-10-CM | POA: Insufficient documentation

## 2014-01-07 DIAGNOSIS — I5032 Chronic diastolic (congestive) heart failure: Secondary | ICD-10-CM

## 2014-01-07 DIAGNOSIS — Z7901 Long term (current) use of anticoagulants: Secondary | ICD-10-CM | POA: Insufficient documentation

## 2014-01-07 DIAGNOSIS — Z79899 Other long term (current) drug therapy: Secondary | ICD-10-CM

## 2014-01-07 DIAGNOSIS — I4891 Unspecified atrial fibrillation: Secondary | ICD-10-CM

## 2014-01-07 LAB — PROTIME-INR
INR: 1.5 ratio — ABNORMAL HIGH (ref 0.8–1.0)
Prothrombin Time: 16.9 s — ABNORMAL HIGH (ref 9.6–13.1)

## 2014-01-07 LAB — BASIC METABOLIC PANEL
BUN: 12 mg/dL (ref 6–23)
CALCIUM: 9.7 mg/dL (ref 8.4–10.5)
CO2: 26 meq/L (ref 19–32)
CREATININE: 0.7 mg/dL (ref 0.4–1.5)
Chloride: 105 mEq/L (ref 96–112)
GFR: 118.06 mL/min (ref >60.00)
GLUCOSE: 89 mg/dL (ref 70–99)
Potassium: 4.1 mEq/L (ref 3.5–5.1)
SODIUM: 139 meq/L (ref 135–145)

## 2014-01-07 MED ORDER — DILTIAZEM HCL 60 MG PO TABS: ORAL_TABLET | ORAL | Status: DC

## 2014-01-07 MED ORDER — FUROSEMIDE 20 MG PO TABS: 20.0000 mg | ORAL_TABLET | Freq: Every day | ORAL | Status: DC

## 2014-01-07 MED ORDER — DILTIAZEM HCL ER COATED BEADS 240 MG PO CP24: 240.0000 mg | ORAL_CAPSULE | Freq: Every day | ORAL | Status: DC

## 2014-01-07 NOTE — Progress Notes (Signed)
Patient ID: Justin Kidd, male   DOB: Apr 25, 1961, 53 y.o.   MRN: [id number]     Patient ID: Justin Kidd MRN: [id number] DOB/AGE: [birth year] 53 y.o.   Referring Physician: Fawn Kirk   Reason for Consultation AFib  HPI: 53 y/o who has had a h/o PAF since age 53.  He was managed medically.  He was never cardioverted.  He has been on Coumadin for many years.  No bleeding problems.  He had stress testing years ago that was normal.  He is anxious and treated with valium.  He has 1-2 episodes/month; each lasting 5-6 hours.  Sx would resolve after extra cardizem.  He was told he had congestive heart failure several years ago.    No recent CP.  Occasional SHOB with lying flat.  No swelling.  No lightheadedness or syncope. EF 60% in 2013.   Current Outpatient Prescriptions  Medication Sig Dispense Refill  . diazepam (VALIUM) 10 MG tablet Take 10 mg by mouth daily.      Marland Kitchen diltiazem (CARDIZEM) 60 MG tablet Take 60 mg by mouth 4 (four) times daily.       . furosemide (LASIX) 20 MG tablet Take 20 mg by mouth every other day.       . warfarin (COUMADIN) 5 MG tablet Take 7.5 mg by mouth daily.       No current facility-administered medications for this visit.   Past Medical History  Diagnosis Date  . Atrial fibrillation   . Pneumonia   . Anxiety   . Coronary artery disease     Family History  Problem Relation Age of Onset  . Heart failure Mother   . Stroke Other     History   Social History  . Marital Status: Divorced    Spouse Name: N/A    Number of Children: N/A  . Years of Education: N/A   Occupational History  . Not on file.   Social History Main Topics  . Smoking status: Never Smoker   . Smokeless tobacco: Never Used  . Alcohol Use: No  . Drug Use: No  . Sexual Activity: Not on file   Other Topics Concern  . Not on file   Social History Narrative  . No narrative on file    Past Surgical History  Procedure Laterality Date  . Shoulder surgery    .  Ankle surgery    . Wrist surgery        (Not in a hospital admission)  Review of systems complete and found to be negative unless listed above .  No nausea, vomiting.  No fever chills, No focal weakness,  No palpitations.  Physical Exam: Filed Vitals:   01/07/14 1205  BP: 132/85  Pulse: 92    Weight: 289 lb 9.6 oz (131.362 kg)  Physical exam:  [place]/AT EOMI No JVD, No carotid bruit RRR S1S2  No wheezing Soft. NT, nondistended No edema. 2+ Dp Pulses bilaterally No focal motor or sensory deficits Normal affect  Labs:   Lab Results  Component Value Date   WBC 6.0 08/18/2013   HGB 14.9 08/18/2013   HCT 43.4 08/18/2013   MCV 90.0 08/18/2013   PLT 202 08/18/2013   No results found for this basename: NA, K, CL, CO2, BUN, CREATININE, CALCIUM, LABALBU, PROT, BILITOT, ALKPHOS, ALT, AST, GLUCOSE,  in the last 168 hours Lab Results  Component Value Date   CKTOTAL 1729* 04/11/2012   CKMB 3.7 04/11/2012   TROPONINI <0.30 08/18/2013  No results found for this basename: CHOL   No results found for this basename: HDL   No results found for this basename: LDLCALC   No results found for this basename: TRIG   No results found for this basename: CHOLHDL   No results found for this basename: LDLDIRECT      Radiology:none EKG: NSR, normal in 1/15  ASSESSMENT AND PLAN: Atrial fibrillation: PAF.  Will switch coumadin to Xarelto.  Will check renal function. Check INR today. Start Xarelto when INR less than 3. Refill diltiazem both long-acting and short-acting. The long-acting will be 240 mg daily. He uses the short acting diltiazem to treat episodes. If these become more frequent, would consider an antiarrhythmic.  I recommended him to followup with his primary care doctor regarding the Valium. There may be better options for long-term treatment of anxiety.  Obesity: He would benefit from weight loss.  ?heart failure: EF was normal in 2013. He may have diastolic dysfunction. Continue  furosemide.  Signed:   Mina Marble, MD, [facility] 01/07/2014, 12:49 PM

## 2014-01-07 NOTE — Patient Instructions (Addendum)
Your physician recommends that you return for lab work today for PT/INR and BMet.   Your physician wants you to follow-up in: 1 year with Dr. Irish Lack. You will receive a reminder letter in the mail two months in advance. If you don't receive a letter, please call our office to schedule the follow-up appointment.

## 2014-01-09 ENCOUNTER — Other Ambulatory Visit: Payer: Self-pay | Admitting: Cardiology

## 2014-01-09 ENCOUNTER — Telehealth: Payer: Self-pay | Admitting: Cardiology

## 2014-01-09 DIAGNOSIS — I4891 Unspecified atrial fibrillation: Secondary | ICD-10-CM

## 2014-01-09 MED ORDER — RIVAROXABAN 20 MG PO TABS
20.0000 mg | ORAL_TABLET | Freq: Every day | ORAL | Status: DC
Start: 2014-01-09 — End: 2014-12-01

## 2014-01-09 NOTE — Telephone Encounter (Signed)
Message copied by Alcario Drought on Thu Jan 09, 2014  9:07 AM ------      Message from: Jettie Booze      Created: Thu Jan 09, 2014  8:27 AM       OK to start Xarelto 20 mg daily ------

## 2014-01-10 ENCOUNTER — Telehealth: Payer: Self-pay | Admitting: Interventional Cardiology

## 2014-01-10 NOTE — Telephone Encounter (Signed)
New message     Patient calling question about medication .

## 2014-01-10 NOTE — Telephone Encounter (Signed)
Called pt and answered medication question.

## 2014-01-13 ENCOUNTER — Telehealth: Payer: Self-pay | Admitting: Interventional Cardiology

## 2014-01-13 NOTE — Telephone Encounter (Signed)
Received request from Nurse fax box, documents faxed for surgical clearance. To: [place] Fax number: [phone] Attention: 6.12.15/rf

## 2014-01-23 ENCOUNTER — Telehealth: Payer: Self-pay | Admitting: Cardiology

## 2014-01-23 NOTE — Telephone Encounter (Signed)
Spoke with Loma Sousa at Dr. Amedeo Plenty office and pt needs clearance to have colonoscopy. He will need to hold Xarelto 3 days prior to procedure.   Fax to [phone]

## 2014-01-24 NOTE — Telephone Encounter (Signed)
Faxed form signed by Dr. Irish Lack yesterday.

## 2014-01-28 ENCOUNTER — Telehealth: Payer: Self-pay | Admitting: Interventional Cardiology

## 2014-01-28 NOTE — Telephone Encounter (Signed)
Faxed to Dr. Amedeo Plenty.

## 2014-01-28 NOTE — Telephone Encounter (Signed)
Follow up:   Please re fax # [phone] attn: [place]      Thanks!    Call Documentation    [place] at 01/24/2014 11:43 AM    Status: Signed        Faxed form signed by Dr. Irish Lack yesterday.        Alcario Drought, CMA at 01/23/2014 8:27 AM    Status: Signed        Spoke with Loma Sousa at Dr. Amedeo Plenty office and pt needs clearance to have colonoscopy. He will need to hold Xarelto 3 days prior to procedure.  Fax to [phone]

## 2014-01-28 NOTE — Telephone Encounter (Signed)
Per Dr. Irish Lack OK to hold Xarelto three days prior to procedure.

## 2014-01-29 ENCOUNTER — Telehealth: Payer: Self-pay | Admitting: Interventional Cardiology

## 2014-01-29 NOTE — Telephone Encounter (Signed)
Received request from Nurse fax box, documents faxed for surgical clearance. To: Justin Kidd Fax number: [phone] Attention: 7.1.15/km

## 2014-01-29 NOTE — Telephone Encounter (Addendum)
Fax form Dr. Amedeo Plenty filled out for the second time and put in nurse fax to be faxed today.

## 2014-02-10 ENCOUNTER — Other Ambulatory Visit: Payer: 59

## 2014-03-06 ENCOUNTER — Other Ambulatory Visit (INDEPENDENT_AMBULATORY_CARE_PROVIDER_SITE_OTHER): Payer: 59

## 2014-03-06 DIAGNOSIS — I4891 Unspecified atrial fibrillation: Secondary | ICD-10-CM

## 2014-03-06 LAB — BASIC METABOLIC PANEL
BUN: 19 mg/dL (ref 6–23)
CALCIUM: 9.3 mg/dL (ref 8.4–10.5)
CO2: 28 meq/L (ref 19–32)
Chloride: 101 mEq/L (ref 96–112)
Creatinine, Ser: 1 mg/dL (ref 0.4–1.5)
GFR: 81.47 mL/min (ref >60.00)
Glucose, Bld: 143 mg/dL — ABNORMAL HIGH (ref 70–99)
Potassium: 4.1 mEq/L (ref 3.5–5.1)
Sodium: 135 mEq/L (ref 135–145)

## 2014-03-06 LAB — CBC
HCT: 47.3 % (ref 39.0–52.0)
HEMOGLOBIN: 15.6 g/dL (ref 13.0–17.0)
MCHC: 33 g/dL (ref 30.0–36.0)
MCV: 92.5 fl (ref 78.0–100.0)
Platelets: 263 10*3/uL (ref 150.0–400.0)
RBC: 5.12 Mil/uL (ref 4.22–5.81)
RDW: 13.7 % (ref 11.5–15.5)
WBC: 12.6 10*3/uL — ABNORMAL HIGH (ref 4.0–10.5)

## 2014-03-17 ENCOUNTER — Other Ambulatory Visit: Payer: Self-pay | Admitting: Cardiology

## 2014-03-17 DIAGNOSIS — I480 Paroxysmal atrial fibrillation: Secondary | ICD-10-CM

## 2014-04-01 ENCOUNTER — Other Ambulatory Visit: Payer: Self-pay

## 2014-04-01 MED ORDER — DILTIAZEM HCL 60 MG PO TABS: ORAL_TABLET | ORAL | Status: DC

## 2014-05-05 ENCOUNTER — Emergency Department (HOSPITAL_COMMUNITY): Payer: 59

## 2014-05-05 ENCOUNTER — Encounter (HOSPITAL_COMMUNITY): Payer: Self-pay | Admitting: Emergency Medicine

## 2014-05-05 ENCOUNTER — Emergency Department (HOSPITAL_COMMUNITY)
Admission: EM | Admit: 2014-05-05 | Discharge: 2014-05-05 | Disposition: A | Discharge status: Home / Self Care | Payer: 59 | Attending: Emergency Medicine | Admitting: Emergency Medicine

## 2014-05-05 DIAGNOSIS — I2510 Atherosclerotic heart disease of native coronary artery without angina pectoris: Secondary | ICD-10-CM | POA: Insufficient documentation

## 2014-05-05 DIAGNOSIS — R1111 Vomiting without nausea: Secondary | ICD-10-CM | POA: Diagnosis not present

## 2014-05-05 DIAGNOSIS — Z8659 Personal history of other mental and behavioral disorders: Secondary | ICD-10-CM | POA: Insufficient documentation

## 2014-05-05 DIAGNOSIS — N200 Calculus of kidney: Secondary | ICD-10-CM | POA: Insufficient documentation

## 2014-05-05 DIAGNOSIS — Z7901 Long term (current) use of anticoagulants: Secondary | ICD-10-CM | POA: Insufficient documentation

## 2014-05-05 DIAGNOSIS — Z8701 Personal history of pneumonia (recurrent): Secondary | ICD-10-CM | POA: Insufficient documentation

## 2014-05-05 DIAGNOSIS — Z79899 Other long term (current) drug therapy: Secondary | ICD-10-CM | POA: Insufficient documentation

## 2014-05-05 DIAGNOSIS — I4891 Unspecified atrial fibrillation: Secondary | ICD-10-CM | POA: Diagnosis not present

## 2014-05-05 DIAGNOSIS — R109 Unspecified abdominal pain: Secondary | ICD-10-CM | POA: Diagnosis present

## 2014-05-05 HISTORY — DX: Calculus of kidney: N20.0

## 2014-05-05 LAB — BASIC METABOLIC PANEL
Anion gap: 14 (ref 5–15)
BUN: 9 mg/dL (ref 6–23)
CALCIUM: 8.9 mg/dL (ref 8.4–10.5)
CO2: 22 mEq/L (ref 19–32)
Chloride: 103 mEq/L (ref 96–112)
Creatinine, Ser: 0.73 mg/dL (ref 0.50–1.35)
GFR calc Af Amer: >90 mL/min (ref >90)
GLUCOSE: 101 mg/dL — AB (ref 70–99)
Potassium: 4.3 mEq/L (ref 3.7–5.3)
Sodium: 139 mEq/L (ref 137–147)

## 2014-05-05 LAB — URINE MICROSCOPIC-ADD ON

## 2014-05-05 LAB — URINALYSIS, ROUTINE W REFLEX MICROSCOPIC
Bilirubin Urine: NEGATIVE
Glucose, UA: NEGATIVE mg/dL
KETONES UR: NEGATIVE mg/dL
Leukocytes, UA: NEGATIVE
Nitrite: NEGATIVE
PROTEIN: NEGATIVE mg/dL
Specific Gravity, Urine: 1.009 (ref 1.005–1.030)
Urobilinogen, UA: 0.2 mg/dL (ref 0.0–1.0)
pH: 5.5 (ref 5.0–8.0)

## 2014-05-05 LAB — CBC WITH DIFFERENTIAL/PLATELET
BASOS PCT: 0 % (ref 0–1)
Basophils Absolute: 0 10*3/uL (ref 0.0–0.1)
EOS ABS: 0.1 10*3/uL (ref 0.0–0.7)
EOS PCT: 1 % (ref 0–5)
HCT: 44.5 % (ref 39.0–52.0)
HEMOGLOBIN: 15.3 g/dL (ref 13.0–17.0)
LYMPHS ABS: 3.1 10*3/uL (ref 0.7–4.0)
Lymphocytes Relative: 33 % (ref 12–46)
MCH: 31 pg (ref 26.0–34.0)
MCHC: 34.4 g/dL (ref 30.0–36.0)
MCV: 90.3 fL (ref 78.0–100.0)
MONOS PCT: 7 % (ref 3–12)
Monocytes Absolute: 0.6 10*3/uL (ref 0.1–1.0)
Neutro Abs: 5.5 10*3/uL (ref 1.7–7.7)
Neutrophils Relative %: 59 % (ref 43–77)
Platelets: 246 10*3/uL (ref 150–400)
RBC: 4.93 MIL/uL (ref 4.22–5.81)
RDW: 12.7 % (ref 11.5–15.5)
WBC: 9.3 10*3/uL (ref 4.0–10.5)

## 2014-05-05 MED ORDER — SODIUM CHLORIDE 0.9 % IV BOLUS (SEPSIS)
1000.0000 mL | Freq: Once | INTRAVENOUS | Status: AC
  Administered 2014-05-05: 1000 mL via INTRAVENOUS

## 2014-05-05 MED ORDER — OXYCODONE-ACETAMINOPHEN 5-325 MG PO TABS
1.0000 | ORAL_TABLET | Freq: Once | ORAL | Status: AC
  Administered 2014-05-05: 1 via ORAL
  Filled 2014-05-05: qty 1

## 2014-05-05 MED ORDER — TAMSULOSIN HCL 0.4 MG PO CAPS: 0.4000 mg | ORAL_CAPSULE | Freq: Every day | ORAL | Status: DC

## 2014-05-05 MED ORDER — ONDANSETRON HCL 4 MG/2ML IJ SOLN
4.0000 mg | Freq: Once | INTRAMUSCULAR | Status: AC
  Administered 2014-05-05: 4 mg via INTRAVENOUS
  Filled 2014-05-05: qty 2

## 2014-05-05 MED ORDER — HYDROMORPHONE HCL 1 MG/ML IJ SOLN
1.0000 mg | Freq: Once | INTRAMUSCULAR | Status: AC
  Administered 2014-05-05: 1 mg via INTRAVENOUS
  Filled 2014-05-05: qty 1

## 2014-05-05 MED ORDER — OXYCODONE-ACETAMINOPHEN 5-325 MG PO TABS: 1.0000 | ORAL_TABLET | Freq: Four times a day (QID) | ORAL | Status: DC | PRN

## 2014-05-05 NOTE — ED Notes (Signed)
Gave patient water 

## 2014-05-05 NOTE — ED Notes (Signed)
Pt c/o left lower abdominal pain and left flank pain x 1 hour. Pt has history of kidney stone in past.

## 2014-05-05 NOTE — ED Notes (Signed)
Pt is aware that urine is needed. Unable to give urine at this time.

## 2014-05-05 NOTE — ED Provider Notes (Signed)
CSN: [id number]     Arrival date & time 05/05/14  1048 History   First MD Initiated Contact with Patient 05/05/14 1052     Chief Complaint  Patient presents with  . Flank Pain  . Abdominal Pain     (Consider location/radiation/quality/duration/timing/severity/associated sxs/prior Treatment) HPI  This a 53 year old male with a history of kidney stones who presents with left flank pain. Onset of pain approximately one hour ago. On initial evaluation, patient is retching and appears very uncomfortable. Reports sharp left flank pain that radiates into his left lower quadrant. It feels like prior kidney stones. He has never required intervention. He denies any urinary symptoms at this time. He denies any fevers. He has nausea and vomiting without diarrhea.  Past Medical History  Diagnosis Date  . Atrial fibrillation   . Pneumonia   . Anxiety   . Coronary artery disease   . Kidney stone    Past Surgical History  Procedure Laterality Date  . Shoulder surgery    . Ankle surgery    . Wrist surgery    . Shoulder arthroscopy with open rotator cuff repair Bilateral   . Ankle surgery Right   . Wrist surgery Right    Family History  Problem Relation Age of Onset  . Heart failure Mother   . Stroke Other    History  Substance Use Topics  . Smoking status: Never Smoker   . Smokeless tobacco: Never Used  . Alcohol Use: No    Review of Systems  Constitutional: Negative.  Negative for fever.  Respiratory: Negative.  Negative for chest tightness and shortness of breath.   Cardiovascular: Negative.  Negative for chest pain.  Gastrointestinal: Positive for nausea and vomiting. Negative for abdominal pain.  Genitourinary: Positive for flank pain. Negative for dysuria and hematuria.  Musculoskeletal: Negative for back pain.  Neurological: Negative for headaches.  All other systems reviewed and are negative.     Allergies  Review of patient's allergies indicates no known  allergies.  Home Medications   Prior to Admission medications   Medication Sig Start Date End Date Taking? Authorizing Provider  diltiazem (CARDIZEM CD) 240 MG 24 hr capsule Take 1 capsule (240 mg total) by mouth daily. 01/07/14  Yes Jettie Booze, MD  diltiazem (CARDIZEM) 60 MG tablet Take 60 mg by mouth daily.   Yes Historical Provider, MD  furosemide (LASIX) 20 MG tablet Take 20 mg by mouth daily as needed for fluid.   Yes Historical Provider, MD  rivaroxaban (XARELTO) 20 MG TABS tablet Take 1 tablet (20 mg total) by mouth daily with supper. 01/09/14  Yes Jettie Booze, MD  oxyCODONE-acetaminophen (PERCOCET/ROXICET) 5-325 MG per tablet Take 1 tablet by mouth every 6 (six) hours as needed for moderate pain or severe pain. 05/05/14   Merryl Hacker, MD  tamsulosin (FLOMAX) 0.4 MG CAPS capsule Take 1 capsule (0.4 mg total) by mouth daily. 05/05/14   Merryl Hacker, MD   BP 112/95  Pulse 88  Resp 12  Ht 6\' 3"  (1.905 m)  Wt 265 lb (120.203 kg)  BMI 33.12 kg/m2  SpO2 99% Physical Exam  Nursing note and vitals reviewed. Constitutional: He is oriented to person, place, and time.  Uncomfortable appearing, retching  HENT:  Head: Normocephalic and atraumatic.  Cardiovascular: Normal rate, regular rhythm and normal heart sounds.   No murmur heard. Pulmonary/Chest: Effort normal and breath sounds normal. No respiratory distress. He has no wheezes.  Abdominal: Soft. Bowel sounds are  normal. There is no tenderness. There is no rebound.  Musculoskeletal: He exhibits no edema.  Lymphadenopathy:    He has no cervical adenopathy.  Neurological: He is alert and oriented to person, place, and time.  Skin: Skin is warm and dry.  Psychiatric: He has a normal mood and affect.    ED Course  Procedures (including critical care time) Labs Review Labs Reviewed  BASIC METABOLIC PANEL - Abnormal; Notable for the following:    Glucose, Bld 101 (*)    All other components within normal  limits  URINALYSIS, ROUTINE W REFLEX MICROSCOPIC - Abnormal; Notable for the following:    Hgb urine dipstick MODERATE (*)    All other components within normal limits  URINE MICROSCOPIC-ADD ON - Abnormal; Notable for the following:    Squamous Epithelial / LPF FEW (*)    All other components within normal limits  CBC WITH DIFFERENTIAL    Imaging Review US Renal  05/05/2014   CLINICAL DATA:  Left flank pain.  History of renal stones.  EXAM: RENAL/URINARY TRACT ULTRASOUND COMPLETE  COMPARISON:  None.  FINDINGS: Right Kidney:  Length: 12.2 cm. Echogenicity within normal limits. No mass or hydronephrosis visualized.  Left Kidney:  Length: 13.3 cm. Normal renal cortical thickness and echogenicity. Mild left pelviectasis.  Bladder:  Appears normal for degree of bladder distention.  IMPRESSION: Mild left pelviectasis without causative etiology identified.   Electronically Signed   By: Lovey Newcomer M.D.   On: 05/05/2014 12:40     EKG Interpretation None      MDM   Final diagnoses:  Kidney stone   Patient presents with left flank pain. Is uncomfortable appearing but nontoxic. Pain is consistent with prior kidney stones. Patient given fluids, pain medication, and nausea medication. Basic labwork was obtained in addition to a renal ultrasound.  No evidence of acute kidney injury or urinary tract infection. He does have mild left hydronephrosis without stone noted.  On repeat exam, patient reports improvement of his symptoms. Discuss with patient deferring further imaging at this time and proceeding with expectant management at home.  Patient is in agreement with this plan. We'll discharge her pain medication and Flomax. Patient was given urology followup. Patient able to tolerate by mouth prior to discharge.  After history, exam, and medical workup I feel the patient has been appropriately medically screened and is safe for discharge home. Pertinent diagnoses were discussed with the patient. Patient  was given return precautions.    Merryl Hacker, MD 05/06/14 [phone]

## 2014-05-05 NOTE — Discharge Instructions (Signed)

## 2014-05-05 NOTE — ED Notes (Signed)
Pt remains monitored by blood pressure, pulse ox, and 5 lead. Pts family remains at bedside. Asked pt to provide urine specimen pt stated he could not provide one at this time. Urinal at bedside.

## 2014-05-05 NOTE — ED Notes (Signed)
Pt remains monitored by blood pressure, pulse ox, and 5 lead.  

## 2014-05-10 ENCOUNTER — Emergency Department (HOSPITAL_COMMUNITY)
Admission: EM | Admit: 2014-05-10 | Discharge: 2014-05-10 | Disposition: A | Discharge status: Home / Self Care | Payer: 59 | Attending: Emergency Medicine | Admitting: Emergency Medicine

## 2014-05-10 ENCOUNTER — Emergency Department (HOSPITAL_COMMUNITY): Payer: 59

## 2014-05-10 ENCOUNTER — Encounter (HOSPITAL_COMMUNITY): Payer: Self-pay | Admitting: Emergency Medicine

## 2014-05-10 DIAGNOSIS — N200 Calculus of kidney: Secondary | ICD-10-CM | POA: Diagnosis not present

## 2014-05-10 DIAGNOSIS — Z8701 Personal history of pneumonia (recurrent): Secondary | ICD-10-CM | POA: Insufficient documentation

## 2014-05-10 DIAGNOSIS — R1032 Left lower quadrant pain: Secondary | ICD-10-CM | POA: Insufficient documentation

## 2014-05-10 DIAGNOSIS — R112 Nausea with vomiting, unspecified: Secondary | ICD-10-CM | POA: Diagnosis not present

## 2014-05-10 DIAGNOSIS — I4891 Unspecified atrial fibrillation: Secondary | ICD-10-CM | POA: Insufficient documentation

## 2014-05-10 DIAGNOSIS — R1012 Left upper quadrant pain: Secondary | ICD-10-CM | POA: Diagnosis present

## 2014-05-10 DIAGNOSIS — I2510 Atherosclerotic heart disease of native coronary artery without angina pectoris: Secondary | ICD-10-CM | POA: Insufficient documentation

## 2014-05-10 DIAGNOSIS — Z79899 Other long term (current) drug therapy: Secondary | ICD-10-CM | POA: Insufficient documentation

## 2014-05-10 LAB — CBC WITH DIFFERENTIAL/PLATELET
Basophils Absolute: 0 10*3/uL (ref 0.0–0.1)
Basophils Relative: 0 % (ref 0–1)
EOS PCT: 0 % (ref 0–5)
Eosinophils Absolute: 0 10*3/uL (ref 0.0–0.7)
HEMATOCRIT: 45.9 % (ref 39.0–52.0)
HEMOGLOBIN: 15.7 g/dL (ref 13.0–17.0)
LYMPHS ABS: 2.7 10*3/uL (ref 0.7–4.0)
LYMPHS PCT: 23 % (ref 12–46)
MCH: 31 pg (ref 26.0–34.0)
MCHC: 34.2 g/dL (ref 30.0–36.0)
MCV: 90.5 fL (ref 78.0–100.0)
MONO ABS: 0.8 10*3/uL (ref 0.1–1.0)
Monocytes Relative: 7 % (ref 3–12)
NEUTROS ABS: 8 10*3/uL — AB (ref 1.7–7.7)
Neutrophils Relative %: 70 % (ref 43–77)
Platelets: 258 10*3/uL (ref 150–400)
RBC: 5.07 MIL/uL (ref 4.22–5.81)
RDW: 12.7 % (ref 11.5–15.5)
WBC: 11.6 10*3/uL — AB (ref 4.0–10.5)

## 2014-05-10 LAB — COMPREHENSIVE METABOLIC PANEL
ALT: 14 U/L (ref 0–53)
AST: 20 U/L (ref 0–37)
Albumin: 4 g/dL (ref 3.5–5.2)
Alkaline Phosphatase: 100 U/L (ref 39–117)
Anion gap: 15 (ref 5–15)
BUN: 10 mg/dL (ref 6–23)
CO2: 26 meq/L (ref 19–32)
CREATININE: 0.91 mg/dL (ref 0.50–1.35)
Calcium: 9.7 mg/dL (ref 8.4–10.5)
Chloride: 101 mEq/L (ref 96–112)
GLUCOSE: 131 mg/dL — AB (ref 70–99)
Potassium: 4.1 mEq/L (ref 3.7–5.3)
Sodium: 142 mEq/L (ref 137–147)
Total Bilirubin: 0.9 mg/dL (ref 0.3–1.2)
Total Protein: 7.8 g/dL (ref 6.0–8.3)

## 2014-05-10 LAB — URINE MICROSCOPIC-ADD ON

## 2014-05-10 LAB — URINALYSIS, ROUTINE W REFLEX MICROSCOPIC
GLUCOSE, UA: NEGATIVE mg/dL
Ketones, ur: NEGATIVE mg/dL
LEUKOCYTES UA: NEGATIVE
Nitrite: NEGATIVE
PH: 5 (ref 5.0–8.0)
Protein, ur: NEGATIVE mg/dL
Specific Gravity, Urine: 1.027 (ref 1.005–1.030)
Urobilinogen, UA: 0.2 mg/dL (ref 0.0–1.0)

## 2014-05-10 MED ORDER — FENTANYL CITRATE 0.05 MG/ML IJ SOLN
50.0000 ug | Freq: Once | INTRAMUSCULAR | Status: AC
  Administered 2014-05-10: 50 ug via INTRAVENOUS
  Filled 2014-05-10: qty 2

## 2014-05-10 MED ORDER — OXYCODONE-ACETAMINOPHEN 5-325 MG PO TABS: 1.0000 | ORAL_TABLET | Freq: Four times a day (QID) | ORAL | Status: DC | PRN

## 2014-05-10 MED ORDER — SODIUM CHLORIDE 0.9 % IV BOLUS (SEPSIS)
1000.0000 mL | Freq: Once | INTRAVENOUS | Status: AC
  Administered 2014-05-10: 1000 mL via INTRAVENOUS

## 2014-05-10 MED ORDER — ONDANSETRON HCL 4 MG PO TABS: 4.0000 mg | ORAL_TABLET | Freq: Four times a day (QID) | ORAL | Status: DC

## 2014-05-10 MED ORDER — HYDROMORPHONE HCL 1 MG/ML IJ SOLN
1.0000 mg | Freq: Once | INTRAMUSCULAR | Status: AC
  Administered 2014-05-10: 1 mg via INTRAVENOUS
  Filled 2014-05-10: qty 1

## 2014-05-10 MED ORDER — ONDANSETRON HCL 4 MG/2ML IJ SOLN
4.0000 mg | Freq: Once | INTRAMUSCULAR | Status: AC
  Administered 2014-05-10: 4 mg via INTRAVENOUS
  Filled 2014-05-10: qty 2

## 2014-05-10 MED ORDER — KETOROLAC TROMETHAMINE 30 MG/ML IJ SOLN
30.0000 mg | Freq: Once | INTRAMUSCULAR | Status: AC
  Administered 2014-05-10: 30 mg via INTRAVENOUS
  Filled 2014-05-10: qty 1

## 2014-05-10 NOTE — ED Notes (Signed)
Pt. reports sudden onset left flank pain onset this morning with emesis , denies hematuria /no dysuria , history of kidney stone .

## 2014-05-10 NOTE — ED Notes (Signed)
PA at bedside.

## 2014-05-10 NOTE — ED Provider Notes (Signed)
CSN: [id number]     Arrival date & time 05/10/14  8676 History   First MD Initiated Contact with Patient 05/10/14 [phone]     No chief complaint on file.    (Consider location/radiation/quality/duration/timing/severity/associated sxs/prior Treatment) HPI Justin Kidd is a 53 year old male with past medical history of A. fib, CAD, kidney stones who presents the ER with left flank pain. Patient states pain began approximately 2:30 AM this morning, has been constant in nature and throbbing. Patient states he was in the ER on Monday, with similar symptoms. Patient states he ran out of his pain medication yesterday. Patient states his pain radiates to his left side of his abdomen. Patient states his pain is consistent with his previous kidney stone pain. Patient also reports nausea and vomiting and mild dysuria since this morning. Patient denies any chest pain, shortness of breath, dizziness, weakness, fever. Past Medical History  Diagnosis Date  . Atrial fibrillation   . Pneumonia   . Anxiety   . Coronary artery disease   . Kidney stone    Past Surgical History  Procedure Laterality Date  . Shoulder surgery    . Ankle surgery    . Wrist surgery    . Shoulder arthroscopy with open rotator cuff repair Bilateral   . Ankle surgery Right   . Wrist surgery Right    Family History  Problem Relation Age of Onset  . Heart failure Mother   . Stroke Other    History  Substance Use Topics  . Smoking status: Never Smoker   . Smokeless tobacco: Never Used  . Alcohol Use: No    Review of Systems  Constitutional: Negative for fever.  HENT: Negative for trouble swallowing.   Eyes: Negative for visual disturbance.  Respiratory: Negative for shortness of breath.   Cardiovascular: Negative for chest pain.  Gastrointestinal: Positive for nausea, vomiting and abdominal pain.  Genitourinary: Positive for dysuria and flank pain.  Musculoskeletal: Negative for neck pain.  Skin: Negative for rash.   Neurological: Negative for dizziness, weakness and numbness.  Psychiatric/Behavioral: Negative.       Allergies  Review of patient's allergies indicates no known allergies.  Home Medications   Prior to Admission medications   Medication Sig Start Date End Date Taking? Authorizing Provider  diltiazem (CARDIZEM CD) 240 MG 24 hr capsule Take 1 capsule (240 mg total) by mouth daily. 01/07/14  Yes Jettie Booze, MD  diltiazem (CARDIZEM) 60 MG tablet Take 60 mg by mouth daily.   Yes Historical Provider, MD  furosemide (LASIX) 20 MG tablet Take 20 mg by mouth daily as needed for fluid.   Yes Historical Provider, MD  oxyCODONE-acetaminophen (PERCOCET/ROXICET) 5-325 MG per tablet Take 1 tablet by mouth every 6 (six) hours as needed for moderate pain or severe pain. 05/05/14  Yes Merryl Hacker, MD  rivaroxaban (XARELTO) 20 MG TABS tablet Take 1 tablet (20 mg total) by mouth daily with supper. 01/09/14  Yes Jettie Booze, MD  tamsulosin (FLOMAX) 0.4 MG CAPS capsule Take 1 capsule (0.4 mg total) by mouth daily. 05/05/14  Yes Merryl Hacker, MD  ondansetron (ZOFRAN) 4 MG tablet Take 1 tablet (4 mg total) by mouth every 6 (six) hours. 05/10/14   Carrie Mew, PA-C  oxyCODONE-acetaminophen (PERCOCET) 5-325 MG per tablet Take 1-2 tablets by mouth every 6 (six) hours as needed. 05/10/14   Carrie Mew, PA-C   BP 112/70  Pulse 84  Temp(Src) 98.3 F (36.8 C) (Oral)  Resp  13  SpO2 97% Physical Exam  Nursing note and vitals reviewed. Constitutional: He is oriented to person, place, and time. He appears well-developed and well-nourished. No distress.  HENT:  Head: Normocephalic and atraumatic.  Mouth/Throat: Oropharynx is clear and moist. No oropharyngeal exudate.  Eyes: EOM are normal. Pupils are equal, round, and reactive to light. Right eye exhibits no discharge. Left eye exhibits no discharge. No scleral icterus.  Neck: Normal range of motion.  Cardiovascular: Normal rate,  regular rhythm and normal heart sounds.   No murmur heard. Pulmonary/Chest: Effort normal and breath sounds normal. No respiratory distress.  Abdominal: Soft. There is no hepatosplenomegaly. There is tenderness in the left upper quadrant and left lower quadrant. There is CVA tenderness. There is no rigidity, no rebound, no guarding, no tenderness at McBurney's point and negative Murphy's sign.  Musculoskeletal: Normal range of motion. He exhibits no edema and no tenderness.  Neurological: He is alert and oriented to person, place, and time. No cranial nerve deficit. Coordination normal.  Skin: Skin is warm and dry. No rash noted. He is not diaphoretic.  Psychiatric: He has a normal mood and affect.    ED Course  Procedures (including critical care time) Labs Review Labs Reviewed  URINALYSIS, ROUTINE W REFLEX MICROSCOPIC - Abnormal; Notable for the following:    Color, Urine AMBER (*)    APPearance CLOUDY (*)    Hgb urine dipstick MODERATE (*)    Bilirubin Urine SMALL (*)    All other components within normal limits  CBC WITH DIFFERENTIAL - Abnormal; Notable for the following:    WBC 11.6 (*)    Neutro Abs 8.0 (*)    All other components within normal limits  COMPREHENSIVE METABOLIC PANEL - Abnormal; Notable for the following:    Glucose, Bld 131 (*)    All other components within normal limits  URINE MICROSCOPIC-ADD ON    Imaging Review Ct Abdomen Pelvis Wo Contrast  05/10/2014   CLINICAL DATA:  Initial encounter for left flank pain and left lower quadrant abdominal pain. Hematuria.  EXAM: CT ABDOMEN AND PELVIS WITHOUT CONTRAST  TECHNIQUE: Multidetector CT imaging of the abdomen and pelvis was performed following the standard protocol without IV contrast.  COMPARISON:  Renal ultrasound 05/05/2014.  FINDINGS: Mild atelectasis is present at the lung bases bilaterally. The heart size is normal. No significant pleural or pericardial effusion is present.  The liver and spleen are within  normal limits.  The stomach, duodenum, and pancreas are unremarkable. The common bile duct and gallbladder are normal. The adrenal glands are normal bilaterally. The right kidney and ureter are within normal limits.  An obstructing 4 mm stone is present at the left UVJ. There is mild dilation of the left ureter and mild to moderate left-sided hydronephrosis. Two additional punctate nonobstructing stones are present within the left kidney. The was blowout stranding about the left kidney.  The rectosigmoid colon demonstrates some diverticular change without focal diverticulitis. The remainder the colon is unremarkable. The appendix is visualized and normal. Small bowel is within normal limits. A paraumbilical hernia contains fat but no bowel. Fat is also herniated into the left inguinal canal without associated bowel.  The bone windows demonstrate mild degenerative changes in the lower lumbar spine. No focal lytic or blastic lesions are evident.  IMPRESSION: 1. Obstructing 4 mm stone at the left UVJ. 2. Two additional nonobstructing stones within the left kidney. 3. Minimal sigmoid diverticulosis without diverticulitis. 4. Umbilical and left inguinal canal hernias contain  fat without bowel. 5. Mild degenerative changes in the lower lumbar spine.   Electronically Signed   By: Lawrence Santiago M.D.   On: 05/10/2014 09:00     EKG Interpretation None      MDM   Final diagnoses:  Kidney stone on left side    53 year old male presenting with rule out flank pain radiating to her left abdomen, patient stating pain is consistent with previous kidney stones. Up with symptom management, CT abdomen pelvis due to to patient being on Xarelto.  UA remarkable for micro hematuria.  No remarkable leukocytosis, anemia.  Renal function with Cr 0.91, BUN 10. No concern for infected stone.   CT abdomen pelvis results with impression: 1. Obstructing 4 mm stone at the left UVJ. 2. Two additional nonobstructing stones within  the left kidney. 3. Minimal sigmoid diverticulosis without diverticulitis. 4. Umbilical and left inguinal canal hernias contain fat without bowel. 5. Mild degenerative changes in the lower lumbar spine.  10:00 AM: Patient stating his pain is still uncontrolled and intolerable. We will try Toradol  10:45 AM: Patient stating his pain is "much better"and tolerable at this time. Pt has been diagnosed with a Kidney Stone via CT. There is no evidence of significant hydronephrosis, serum creatine WNL, vitals sign stable and the pt does not have irratractable vomiting. Pt will be dc home with pain medications & has been advised to follow up with PCP.  I strongly recommended patient follow up with urology. I discussed return precautions with patient, and encouraged him to call or return to ER should he have any questions or concerns.Pt has been diagnosed with a Kidney Stone via CT. There is no evidence of significant hydronephrosis, serum creatine WNL, vitals sign stable and the pt does not have irratractable vomiting. Pt will be dc home with pain medications & has been advised to follow up with PCP.   BP 112/70  Pulse 84  Temp(Src) 98.3 F (36.8 C) (Oral)  Resp 13  SpO2 97%  Signed,  Dahlia Bailiff, PA-C 4:32 PM  This patient discussed with Dr. Davonna Belling, M.D.  Carrie Mew, PA-C 05/10/14 [phone]

## 2014-05-10 NOTE — ED Notes (Signed)
Patient transported to CT 

## 2014-05-10 NOTE — Discharge Instructions (Signed)

## 2014-05-11 NOTE — ED Provider Notes (Signed)
Medical screening examination/treatment/procedure(s) were performed by non-physician practitioner and as supervising physician I was immediately available for consultation/collaboration.   EKG Interpretation None       Jasper Riling. Alvino Chapel, MD 05/11/14 5974

## 2014-08-09 IMAGING — CR DG CHEST 2V
2 series · 2 of 2 positions shown · non-contrast
Comparison: Chest radiograph performed 12/09/2012

CLINICAL DATA: Chest pain and shortness of breath.

EXAM:
CHEST  2 VIEW

[w chest pa]
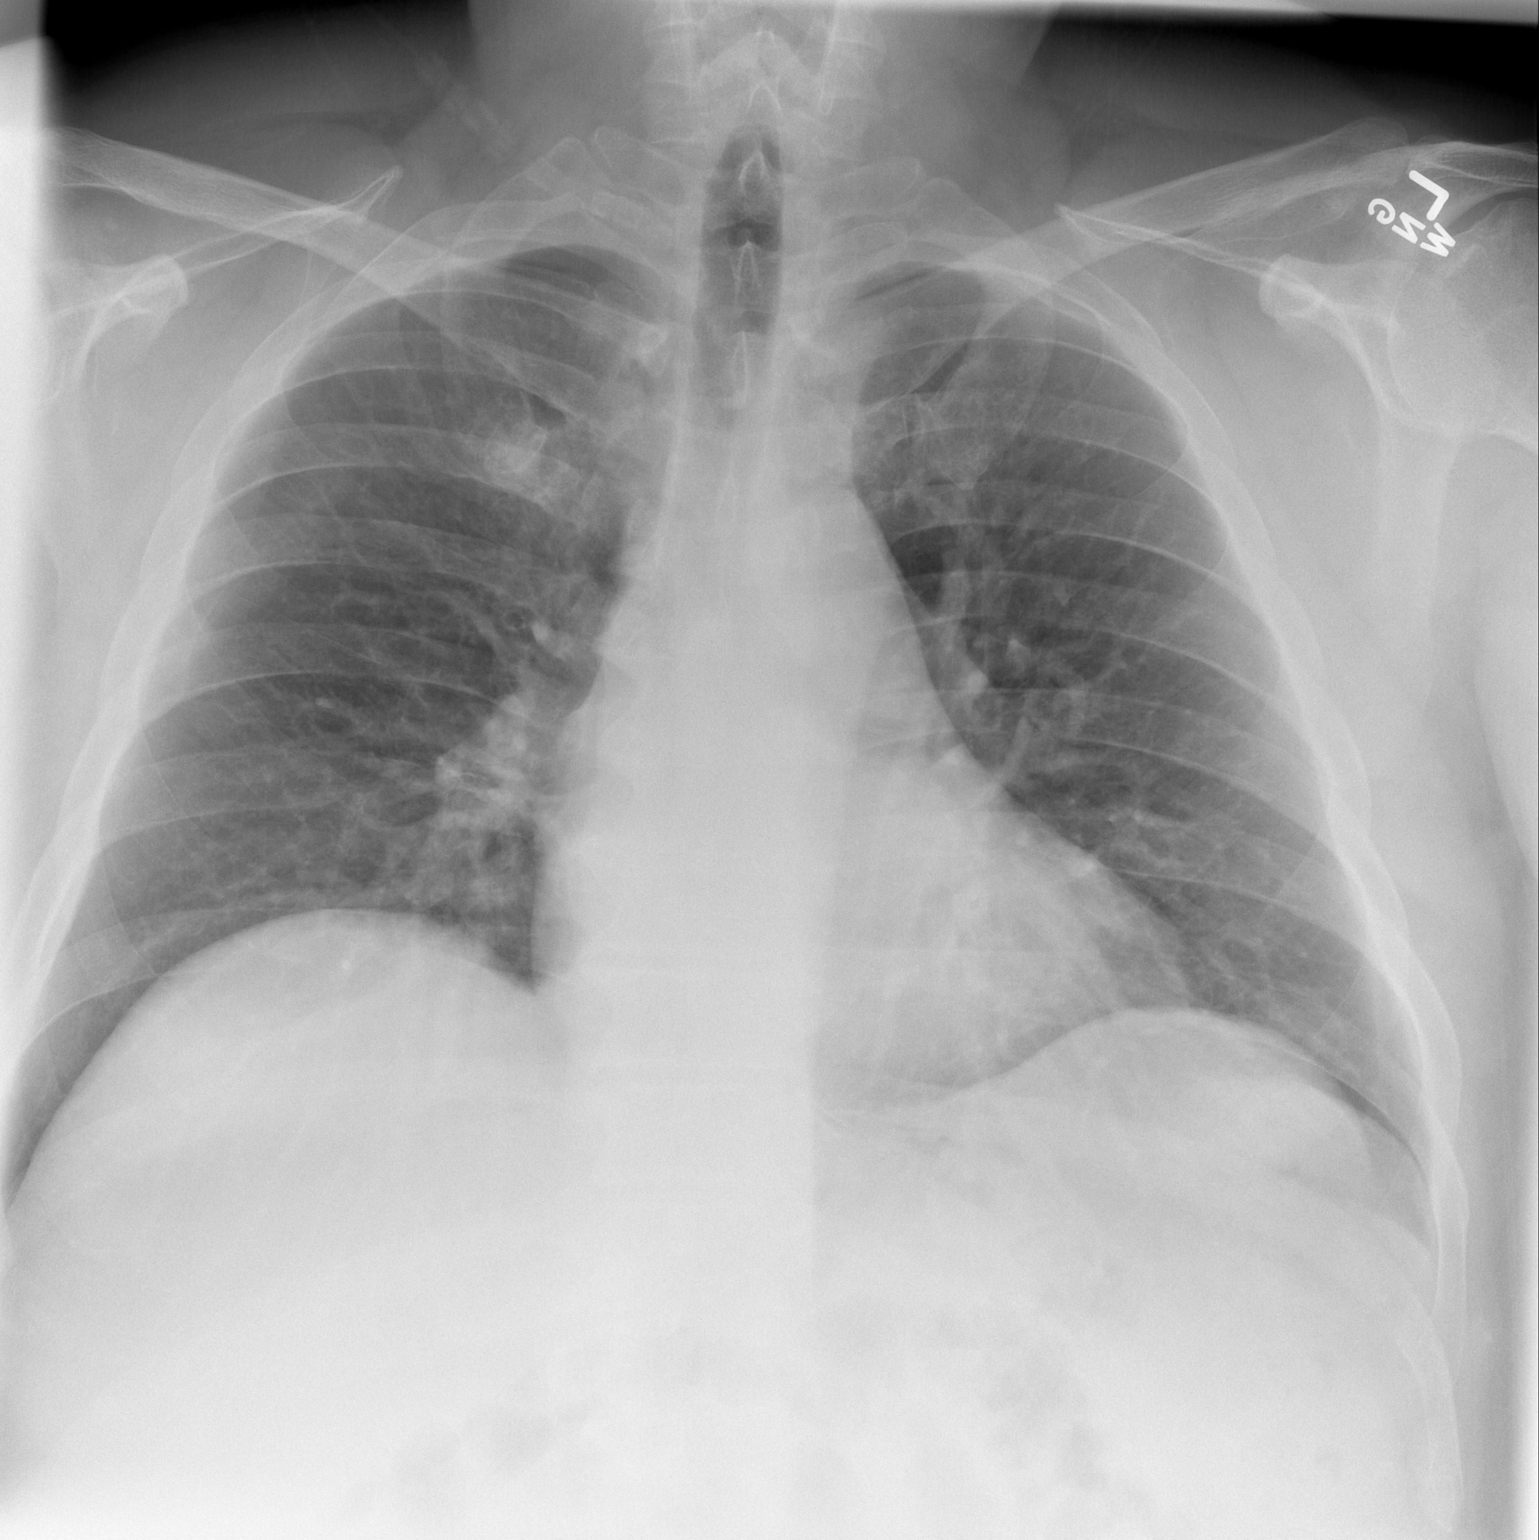

[w chest lat *]
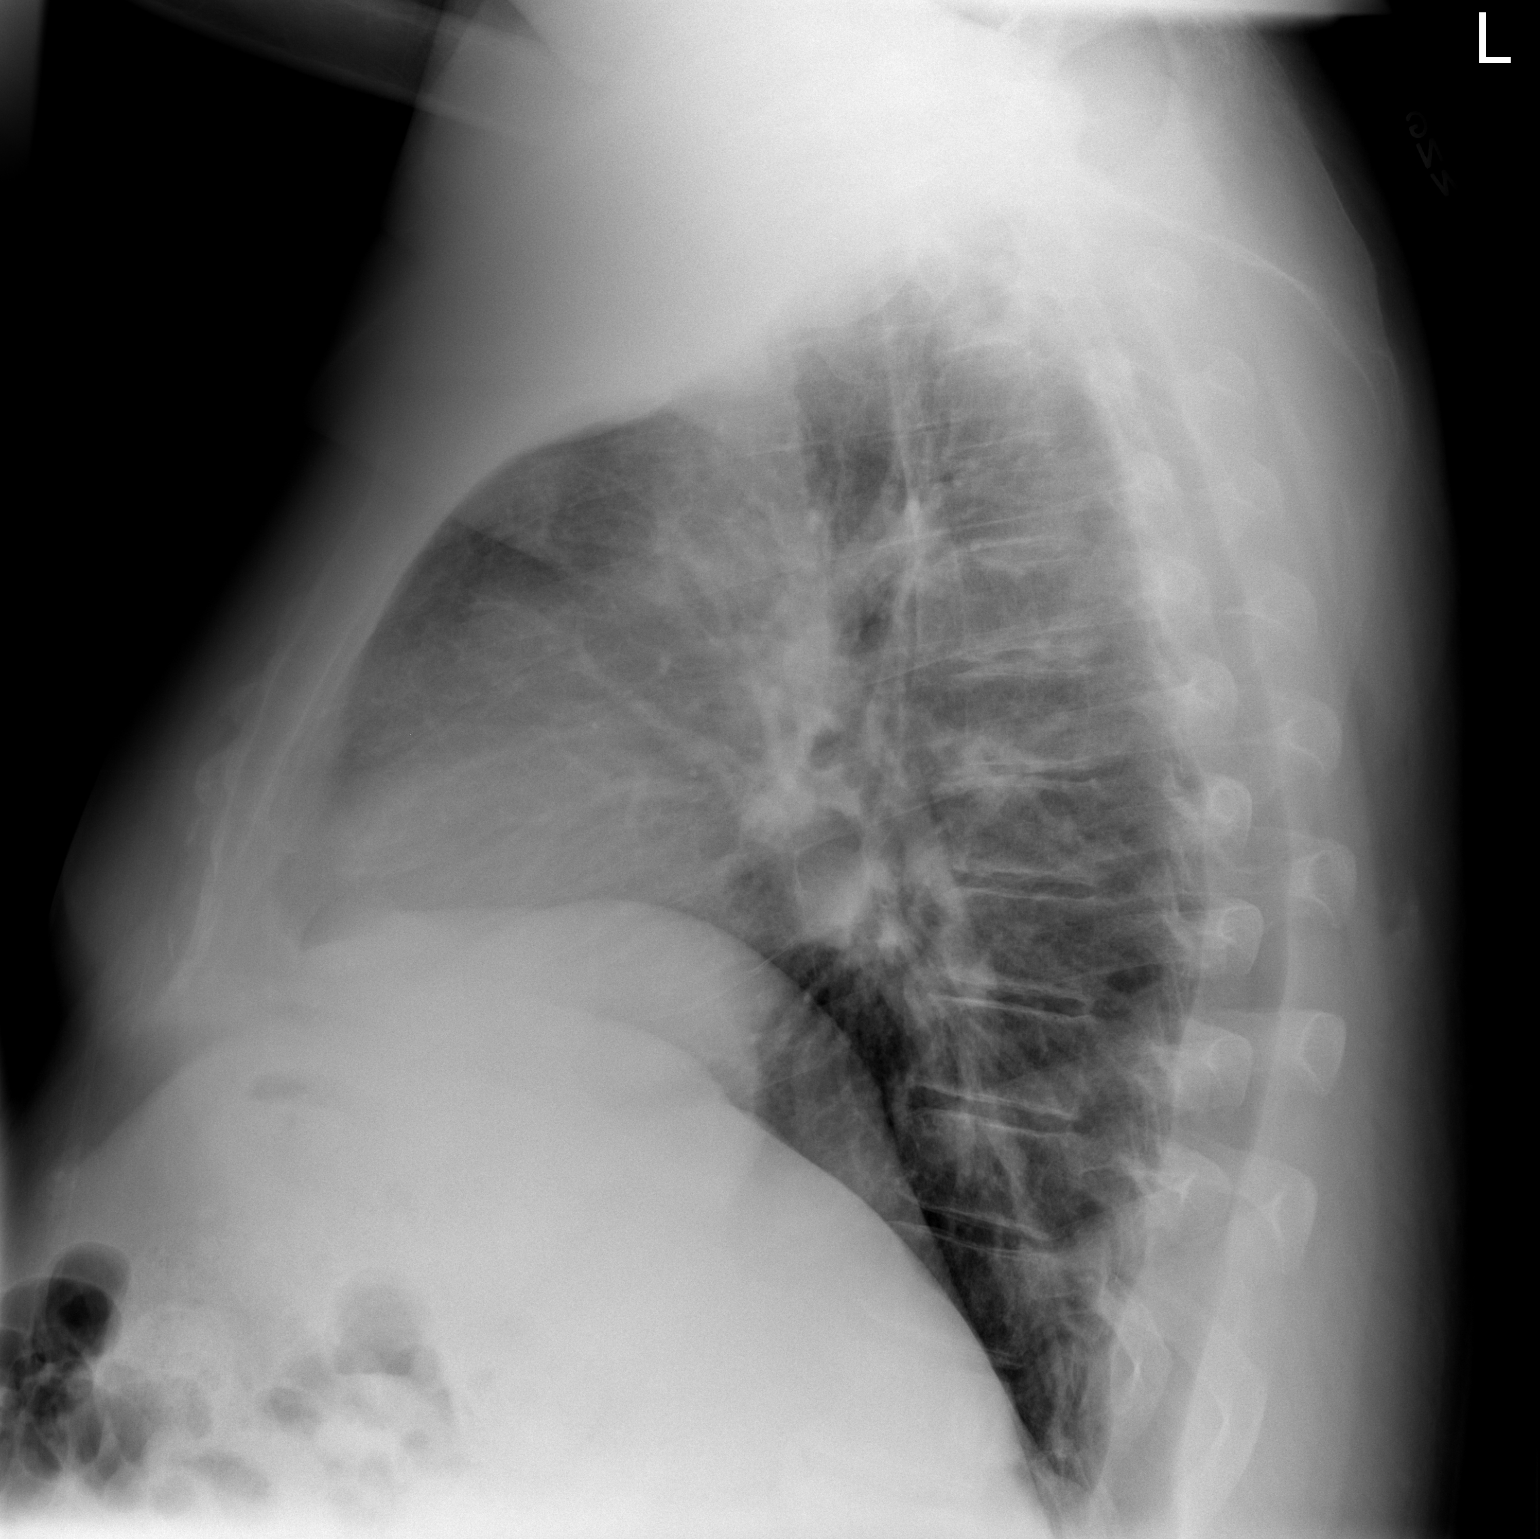

[2 of 2 positions shown; findings below may reference images not displayed]

FINDINGS: The lungs are well-aerated. Pulmonary vascularity is at the upper
limits of normal. There is no evidence of focal opacification,
pleural effusion or pneumothorax.

The heart is normal in size; the mediastinal contour is within
normal limits. No acute osseous abnormalities are seen.
IMPRESSION: No acute cardiopulmonary process seen.

## 2014-09-08 ENCOUNTER — Other Ambulatory Visit: Payer: Self-pay | Admitting: *Deleted

## 2014-09-08 ENCOUNTER — Other Ambulatory Visit (INDEPENDENT_AMBULATORY_CARE_PROVIDER_SITE_OTHER): Payer: 59 | Admitting: *Deleted

## 2014-09-08 DIAGNOSIS — I480 Paroxysmal atrial fibrillation: Secondary | ICD-10-CM

## 2014-09-08 LAB — BASIC METABOLIC PANEL
BUN: 10 mg/dL (ref 6–23)
CHLORIDE: 107 meq/L (ref 96–112)
CO2: 26 meq/L (ref 19–32)
Calcium: 8.8 mg/dL (ref 8.4–10.5)
Creatinine, Ser: 0.84 mg/dL (ref 0.40–1.50)
GFR: 101.73 mL/min (ref >60.00)
GLUCOSE: 140 mg/dL — AB (ref 70–99)
POTASSIUM: 3.3 meq/L — AB (ref 3.5–5.1)
Sodium: 139 mEq/L (ref 135–145)

## 2014-09-08 LAB — CBC
HEMATOCRIT: 43 % (ref 39.0–52.0)
HEMOGLOBIN: 14.7 g/dL (ref 13.0–17.0)
MCHC: 34.1 g/dL (ref 30.0–36.0)
MCV: 87.4 fl (ref 78.0–100.0)
PLATELETS: 249 10*3/uL (ref 150.0–400.0)
RBC: 4.92 Mil/uL (ref 4.22–5.81)
RDW: 13.2 % (ref 11.5–15.5)
WBC: 6.5 10*3/uL (ref 4.0–10.5)

## 2014-09-08 MED ORDER — POTASSIUM CHLORIDE CRYS ER 20 MEQ PO TBCR: EXTENDED_RELEASE_TABLET | ORAL | Status: DC

## 2014-09-10 ENCOUNTER — Encounter: Payer: Self-pay | Admitting: Interventional Cardiology

## 2014-09-10 ENCOUNTER — Ambulatory Visit (INDEPENDENT_AMBULATORY_CARE_PROVIDER_SITE_OTHER): Payer: 59 | Admitting: Interventional Cardiology

## 2014-09-10 VITALS — BP 124/90 | HR 90 | Ht 75.0 in | Wt 305.8 lb

## 2014-09-10 DIAGNOSIS — R400 Somnolence: Secondary | ICD-10-CM | POA: Insufficient documentation

## 2014-09-10 DIAGNOSIS — Z7901 Long term (current) use of anticoagulants: Secondary | ICD-10-CM

## 2014-09-10 DIAGNOSIS — I4891 Unspecified atrial fibrillation: Secondary | ICD-10-CM

## 2014-09-10 DIAGNOSIS — E669 Obesity, unspecified: Secondary | ICD-10-CM

## 2014-09-10 DIAGNOSIS — G4710 Hypersomnia, unspecified: Secondary | ICD-10-CM

## 2014-09-10 DIAGNOSIS — G4719 Other hypersomnia: Secondary | ICD-10-CM

## 2014-09-10 NOTE — Progress Notes (Signed)
Patient ID: Justin Kidd, male   DOB: [birth year], 54 y.o.   MRN: [id number]    Cardiology Office Note   Date:  09/10/2014   ID:  Justin Kidd, DOB 01-04-1961, MRN [id number]  PCP:  REDMON,NOELLE, PA-C  Cardiologist:   Jettie Booze., MD   No chief complaint on file.     History of Present Illness: Justin Kidd is a 54 y.o. male who presents for a h/o PAF since age 76.  He was managed medically.  He was never cardioverted.  He has been on Coumadin for many years.  No bleeding problems.  He had stress testing years ago that was normal.  He is anxious and treated with valium.  He was having 1-2 episodes/month; each lasting 5-6 hours.  Sx would resolve after extra cardizem.  He was told he had congestive heart failure several years ago.  Over the past few weeks he has more AFib, at least 5-6 times a week.  He was out of his long acting diltiazem and was taking 4 60 mg tabs at one time.  He was counseled to take 1 tab every 6 hours.    No recent CP.  Occasional SHOB with lying flat.  Definitely when lying on his back.  Worse over the past 3 weeks, while the AFib has been more active.  No swelling.  No lightheadedness or syncope. EF 60% in 2013.  He walks 5 days a week for over an hour at a time. He is hoping to have back surgery.       Past Medical History  Diagnosis Date  . Atrial fibrillation   . Pneumonia   . Anxiety   . Coronary artery disease   . Kidney stone     Past Surgical History  Procedure Laterality Date  . Shoulder surgery    . Ankle surgery    . Wrist surgery    . Shoulder arthroscopy with open rotator cuff repair Bilateral   . Ankle surgery Right   . Wrist surgery Right      Current Outpatient Prescriptions  Medication Sig Dispense Refill  . diltiazem (CARDIZEM CD) 240 MG 24 hr capsule Take 1 capsule (240 mg total) by mouth daily. 90 capsule 3  . diltiazem (CARDIZEM) 60 MG tablet Take 60 mg by mouth daily.    . furosemide (LASIX) 20 MG  tablet Take 20 mg by mouth daily as needed for fluid.    . furosemide (LASIX) 20 MG tablet Take 20 mg by mouth as needed. fluid    . hydrOXYzine (VISTARIL) 25 MG capsule Take 25 mg by mouth as needed. For anxiety  0  . methocarbamol (ROBAXIN) 750 MG tablet Take 750 mg by mouth as needed. For spasms  2  . potassium chloride SA (K-DUR,KLOR-CON) 20 MEQ tablet 1 tablet by mouth daily on the days you take furosemide(lasix) 30 tablet 0  . rivaroxaban (XARELTO) 20 MG TABS tablet Take 1 tablet (20 mg total) by mouth daily with supper. 30 tablet 6  . rOPINIRole (REQUIP) 1 MG tablet Take 1 mg by mouth 3 (three) times daily.  1   No current facility-administered medications for this visit.    Allergies:   Review of patient's allergies indicates no known allergies.    Social History:  The patient  reports that he has never smoked. He has never used smokeless tobacco. He reports that he does not drink alcohol or use illicit drugs.   Family History:  The patient's *family  history includes Heart failure in his mother; Stroke in his other.    ROS:  Please see the history of present illness.   Otherwise, review of systems are positive for palpitations.   All other systems are reviewed and negative.    PHYSICAL EXAM: VS:  BP 124/90 mmHg  Pulse 90  Ht 6\' 3"  (1.905 m)  Wt 305 lb 12.8 oz (138.71 kg)  BMI 38.22 kg/m2 , BMI Body mass index is 38.22 kg/(m^2). GEN: Well nourished, well developed, in no acute distress HEENT: normal Neck: no JVD, carotid bruits, or masses Cardiac: *RRR; no murmurs, rubs, or gallops,no edema  Respiratory:  clear to auscultation bilaterally, normal work of breathing GI: soft, nontender, nondistended, + BS MS: no deformity or atrophy Skin: warm and dry, no rash Neuro:  Strength and sensation are intact Psych: euthymic mood, full affect   EKG:  EKG is ordered today. The ekg ordered today demonstrates normal   Recent Labs: 05/10/2014: ALT 14 09/08/2014: BUN 10;  Creatinine 0.84; Hemoglobin 14.7; Platelets 249.0; Potassium 3.3*; Sodium 139    Lipid Panel No results found for: CHOL, TRIG, HDL, CHOLHDL, VLDL, LDLCALC, LDLDIRECT    Wt Readings from Last 3 Encounters:  09/10/14 305 lb 12.8 oz (138.71 kg)  05/05/14 265 lb (120.203 kg)  01/07/14 289 lb 9.6 oz (131.362 kg)      Other studies Reviewed: Additional studies/ records that were reviewed today include: prior echo from 2013: normal LV function.    ASSESSMENT AND PLAN:  1.  Atrial fibrillation: We'll try to get him back on his regular regimen of long-acting diltiazem and when necessary short acting diltiazem. Will have him take 2 extra doses of potassium as well. He was hypokalemic. I encouraged him to consume more potassium-rich foods. If his atrial fibrillation persists, will consider adding flecainide and he would also need a monitor. We'll also send him for sleep study. He has symptoms of daytime somnolence.  He would consider ablation if he failed another medicine.   2.  Overweight: His atrial fibrillation may decrease also with weight loss. Will check to see whether he needs CPap.   Current medicines are reviewed at length with the patient today.  The patient has concerns regarding medicines.  The following changes have been made:  He was counseled about the use of diltiazem. Changes to potassium as noted above.  Labs/ tests ordered today include: Sleep study ordered     Disposition:   FU with MD in 4 month   Teresita Madura., MD  09/10/2014 8:19 AM    [facility] [address], [place], [place]  [address] Phone: [phone]; Fax: [phone]

## 2014-09-10 NOTE — Patient Instructions (Signed)
Your physician has recommended you make the following change in your medication:  1) TAKE Diltiazem 240mg  daily  Your physician has recommended that you have a sleep study. This test records several body functions during sleep, including: brain activity, eye movement, oxygen and carbon dioxide blood levels, heart rate and rhythm, breathing rate and rhythm, the flow of air through your mouth and nose, snoring, body muscle movements, and chest and belly movement.  Your physician recommends that you schedule a follow-up appointment in: 4 months with Dr. Irish Lack.

## 2014-09-16 ENCOUNTER — Ambulatory Visit (HOSPITAL_BASED_OUTPATIENT_CLINIC_OR_DEPARTMENT_OTHER): Payer: 59 | Attending: Interventional Cardiology | Admitting: Radiology

## 2014-09-16 VITALS — Ht 75.0 in | Wt 305.0 lb

## 2014-09-16 DIAGNOSIS — I480 Paroxysmal atrial fibrillation: Secondary | ICD-10-CM

## 2014-09-16 DIAGNOSIS — G4719 Other hypersomnia: Secondary | ICD-10-CM | POA: Diagnosis present

## 2014-09-18 ENCOUNTER — Telehealth: Payer: Self-pay | Admitting: Cardiology

## 2014-09-18 DIAGNOSIS — G4733 Obstructive sleep apnea (adult) (pediatric): Secondary | ICD-10-CM

## 2014-09-18 NOTE — Telephone Encounter (Signed)
Please let patient know that he has mild OSA with significant oxygen desaturations.  Please set up for CPAP titration

## 2014-09-18 NOTE — Sleep Study (Addendum)
   NAME: Justin Kidd DATE OF BIRTH:  [birth year] MEDICAL RECORD NUMBER [id number]  LOCATION: [facility]  PHYSICIAN: TURNER,TRACI R  DATE OF STUDY: 09/16/2014  SLEEP STUDY TYPE: Nocturnal Polysomnogram               REFERRING PHYSICIAN: Jettie Booze, MD  INDICATION FOR STUDY: Snoring, excessive daytime sleepiness  EPWORTH SLEEPINESS SCORE: 3 HEIGHT: 6\' 3"  (190.5 cm)  WEIGHT: (!) 305 lb (138.347 kg)    Body mass index is 38.12 kg/(m^2).  NECK SIZE: 18.5 in.  MEDICATIONS: Reviewed in the chart  SLEEP ARCHITECTURE: The patient slept for a total of 401 minutes out of a total sleep period of 415 minutes.  There was 1 minutes of slow wave sleep and 94 minutes of REM sleep.  The onset to sleep latency was normal at 3 minutes and onset to REM sleep latency was short at 11 minutes.  The sleep efficiency was normal at 96%.  RESPIRATORY DATA: The patient had a total of 1 central apnea and 66 hypopneas for a total AHI of 10 events per hour.  This is consistent with mild obstructive sleep apnea/hypopnea syndrome.  There was mild snoring.  Most events occurred in the supine position during REM sleep.    OXYGEN DATA: The lowest oxygen saturation during REM sleep was 83% and during NREM sleep was 85%.  The amount of time spent with Oxygen saturations lower than 88% was 10 minutes.    CARDIAC DATA: The patient maintained NSR with PAC's during the study.  The average heart rate was 76bpm with maximum heart rate 148 bpm and minimum heart rate 51bpm.    MOVEMENT/PARASOMNIA: There were a large number of periodic limb movements associated with arousals with a PLMS index of 87 movements per hour.  There were no REM behavior sleep disorders noted.    IMPRESSION/ RECOMMENDATION:   1.  Mild obstructive sleep apnea/hypopnea syndrome with an AHI of 10 events per hour.  Most events occurred in the supine position during REM sleep. The patient should be counseled to avoid sleeping  in the supine position.   2.  Mild Snoring. 3.  Frequent oxygen desaturations with respiratory events with oxygen desaturations <885 for 10 minutes of the study. 4.  Normal sleep efficiency and sleep stages 5.  Increased periodic limb movements resulting in increased arousals.  The PLMS index was elevated at 87 movements/hour.   6.  Obesity.  The patient should be counseled in good sleep hygiene and weight loss. 7.  Recommend a trial of CPAP therapy given the patient's complaints excessive daytime sleepiness, significant oxygen desaturations, sleep disordered breathing and atrial fibrillation. 8.  NSR with PAC's.  Average heart rate 76bpm.    Sueanne Margarita Diplomate, American Board of Sleep Medicine  ELECTRONICALLY SIGNED ON:  09/18/2014, 10:08 AM [place] PH: [phone]   FX: [phone] [place]

## 2014-09-22 NOTE — Telephone Encounter (Signed)
Patient informed of results and verbal understanding expressed.   CPAP titration ordered for scheduling. Patient agrees with treatment plan.

## 2014-09-22 NOTE — Addendum Note (Signed)
Addended by: Harland [place] A on: 09/22/2014 02:41 PM   Modules accepted: Orders

## 2014-10-06 ENCOUNTER — Telehealth: Payer: Self-pay | Admitting: Interventional Cardiology

## 2014-10-06 DIAGNOSIS — I4891 Unspecified atrial fibrillation: Secondary | ICD-10-CM

## 2014-10-06 NOTE — Telephone Encounter (Signed)
New Message         Pt calling stating that he has had A-fib 5 or 6 times since the last time he saw Dr. Irish Lack. Pt states that Dr. Irish Lack told him to call our office if he was still having A-fib, told pt that he would switch him to a different medication. Please call back and advise.

## 2014-10-06 NOTE — Telephone Encounter (Signed)
Spoke with pt who reports being in At Fib 5 or 6 times since he was seen in 09/2014.  When is occurs he feels like he has ran a marathon and gets very SOB.  He is taking Diltiazem 240 mg a day and extra 60 mg to convert when necessary.  He is also on Xarelto and has not missed any doses.  Aware I will forward to Dr Irish Lack for further instructions or orders since pt states he was told his medication would be changed to he continues to go in and out of At Fib.  Aware he will be called back with any changes.

## 2014-10-06 NOTE — Telephone Encounter (Signed)
Check BMet.  If renal function ok, will add flecainide 100 mg BID.   He would need ETT 1 week after starting flecainide.  No exercise after starting flecainide until the stress test.

## 2014-10-07 NOTE — Telephone Encounter (Signed)
I spoke with the patient. He is agreeable with the plan for flecainide. He will come in the office tomorrow for lab work. If labs ok, we will need to see when the GXT can be done prior to the patient starting flecainide to ensure we get it in the appropriate time frame.

## 2014-10-08 ENCOUNTER — Other Ambulatory Visit (INDEPENDENT_AMBULATORY_CARE_PROVIDER_SITE_OTHER): Payer: Self-pay | Admitting: *Deleted

## 2014-10-08 DIAGNOSIS — I4891 Unspecified atrial fibrillation: Secondary | ICD-10-CM

## 2014-10-08 LAB — BASIC METABOLIC PANEL
BUN: 12 mg/dL (ref 6–23)
CALCIUM: 8.9 mg/dL (ref 8.4–10.5)
CO2: 27 meq/L (ref 19–32)
Chloride: 103 mEq/L (ref 96–112)
Creatinine, Ser: 0.74 mg/dL (ref 0.40–1.50)
GFR: 117.72 mL/min (ref >60.00)
Glucose, Bld: 123 mg/dL — ABNORMAL HIGH (ref 70–99)
Potassium: 3.6 mEq/L (ref 3.5–5.1)
Sodium: 137 mEq/L (ref 135–145)

## 2014-10-10 ENCOUNTER — Telehealth: Payer: Self-pay | Admitting: Interventional Cardiology

## 2014-10-10 NOTE — Telephone Encounter (Signed)
Left message for pt to call back to verify if he is taking ASA. It is not listed on his medication list.

## 2014-10-10 NOTE — Telephone Encounter (Signed)
New message      Request for surgical clearance:  What type of surgery is being performed? Spine surgery 1. When is this surgery scheduled? Pending clearance  2. Are there any medications that need to be held prior to surgery and how long? aspirin  3. Name of physician performing surgery?  They have not picked a physician yet  4. What is your office phone and fax number? Fax [phone] clearance.  They faxed a clearance to [place] last week

## 2014-10-10 NOTE — Telephone Encounter (Signed)
Spoke with pt and he clarified that he is not taking ASA, he is just taking the Xarelto. Informed pt that I had clearance letter and would get it sent over to the [facility]. Pt verbalized understanding and was in agreement with this plan. Left voicemail for Kaitlin at [facility].

## 2014-10-13 NOTE — Telephone Encounter (Signed)
Left message to call back  

## 2014-10-13 NOTE — Telephone Encounter (Signed)
Left message for Kaitlin to call back.

## 2014-10-14 ENCOUNTER — Other Ambulatory Visit: Payer: Self-pay | Admitting: Interventional Cardiology

## 2014-10-15 MED ORDER — FLECAINIDE ACETATE 100 MG PO TABS: 100.0000 mg | ORAL_TABLET | Freq: Two times a day (BID) | ORAL | Status: DC

## 2014-10-15 NOTE — Telephone Encounter (Signed)
The patient's BMP is WNL. He has been scheduled for 10/28/14 for a GXT with Dr. Irish Lack.  The patient is aware that his GXT is at 2pm- verbal instructions given to the patient and a copy of the instructions have also been mailed to him. He is aware to start Flecainide 100 mg BID on 10/21/14, which will be one week prior to his GXT. RX for flecainide will be sent to [facility] on [address].

## 2014-10-15 NOTE — Telephone Encounter (Signed)
Spoke with Elmyra Ricks at [facility]. Elmyra Ricks states that they did not receive the clearance to stop Xarelto. Informed her that I would have it refaxed and to contact our office if she still does not receive it. Elmyra Ricks verbalized understanding and was in agreement with this plan.

## 2014-10-28 ENCOUNTER — Ambulatory Visit (INDEPENDENT_AMBULATORY_CARE_PROVIDER_SITE_OTHER): Payer: 59 | Admitting: Interventional Cardiology

## 2014-10-28 DIAGNOSIS — I4891 Unspecified atrial fibrillation: Secondary | ICD-10-CM

## 2014-10-28 NOTE — Progress Notes (Signed)
Exercise Treadmill Test  Pre-Exercise Testing Evaluation Rhythm: normal sinus  Rate: 77 bpm     Test  Exercise Tolerance Test Ordering MD: Casandra Doffing, MD  Interpreting MD: Casandra Doffing, MD  Unique Test No: 1  Treadmill:  1  Indication for ETT: afib  Contraindication to ETT: No   Stress Modality: exercise - treadmill  Cardiac Imaging Performed: non   Protocol: standard Bruce - maximal  Max BP:  213/72  Max MPHR (bpm):  168 85% MPR (bpm):  143  MPHR obtained (bpm):  148 % MPHR obtained:  88  Reached 85% MPHR (min:sec):  5:26 Total Exercise Time (min-sec):  6:00  Workload in METS:  7 Borg Scale: 14  Reason ETT Terminated:  target HR achieved    ST Segment Analysis At Rest: normal ST segments - no evidence of significant ST depression With Exercise: no evidence of significant ST depression  Other Information Arrhythmia:  No Angina during ETT:  absent (0) Quality of ETT:  diagnostic  ETT Interpretation:  normal - no evidence of ischemia by ST analysis  Comments: No arrhythmia  Recommendations: Continue flecainide. Call office if there are more sx of AFib.  Jettie Booze, MD

## 2014-11-08 ENCOUNTER — Other Ambulatory Visit: Payer: Self-pay | Admitting: Interventional Cardiology

## 2014-11-17 ENCOUNTER — Ambulatory Visit (HOSPITAL_BASED_OUTPATIENT_CLINIC_OR_DEPARTMENT_OTHER): Payer: 59 | Attending: Cardiology

## 2014-11-17 VITALS — Ht 75.0 in | Wt 305.0 lb

## 2014-11-17 DIAGNOSIS — G4733 Obstructive sleep apnea (adult) (pediatric): Secondary | ICD-10-CM

## 2014-11-17 DIAGNOSIS — R0683 Snoring: Secondary | ICD-10-CM | POA: Insufficient documentation

## 2014-11-17 DIAGNOSIS — Z9989 Dependence on other enabling machines and devices: Secondary | ICD-10-CM

## 2014-11-20 ENCOUNTER — Ambulatory Visit (INDEPENDENT_AMBULATORY_CARE_PROVIDER_SITE_OTHER): Payer: 59 | Admitting: Cardiology

## 2014-11-20 ENCOUNTER — Telehealth: Payer: Self-pay | Admitting: Interventional Cardiology

## 2014-11-20 ENCOUNTER — Encounter: Payer: Self-pay | Admitting: Cardiology

## 2014-11-20 VITALS — BP 120/72 | HR 117 | Ht 75.0 in | Wt 318.6 lb

## 2014-11-20 DIAGNOSIS — I4891 Unspecified atrial fibrillation: Secondary | ICD-10-CM

## 2014-11-20 LAB — BASIC METABOLIC PANEL
BUN: 11 mg/dL (ref 6–23)
CALCIUM: 9.4 mg/dL (ref 8.4–10.5)
CHLORIDE: 102 meq/L (ref 96–112)
CO2: 29 meq/L (ref 19–32)
Creatinine, Ser: 0.99 mg/dL (ref 0.40–1.50)
GFR: 84.1 mL/min (ref >60.00)
Glucose, Bld: 102 mg/dL — ABNORMAL HIGH (ref 70–99)
Potassium: 4.3 mEq/L (ref 3.5–5.1)
Sodium: 137 mEq/L (ref 135–145)

## 2014-11-20 MED ORDER — PROPAFENONE HCL 225 MG PO TABS: 225.0000 mg | ORAL_TABLET | Freq: Two times a day (BID) | ORAL | Status: DC

## 2014-11-20 NOTE — Telephone Encounter (Signed)
New message     Pt c/o medication issue:  1. Name of Medication: flecainide 100 mg  2. How are you currently taking this medication (dosage and times per day)? Twice a day    3. Are you having a reaction (difficulty breathing--STAT)? Breathing , still going into afib   4. What is your medication issue? Discuss, option.

## 2014-11-20 NOTE — Patient Instructions (Signed)
Medication Instructions:  STOP Flecainide  START Rythmol 225mg  Twice daily. START medication on 11/21/14. An Rx has been sent to your pharmacy Take all other medications as prescribed  Labwork: Bmet today  Testing/Procedures: Your physician has requested that you have an echocardiogram. Echocardiography is a painless test that uses sound waves to create images of your heart. It provides your doctor with information about the size and shape of your heart and how well your heart's chambers and valves are working. This procedure takes approximately one hour. There are no restrictions for this procedure.( To be scheduled after 11/25/14)  Your physician has recommended that you have a Cardioversion (DCCV). Electrical Cardioversion uses a jolt of electricity to your heart either through paddles or wired patches attached to your chest. This is a controlled, usually prescheduled, procedure. Defibrillation is done under light anesthesia in the hospital, and you usually go home the day of the procedure. This is done to get your heart back into a normal rhythm. You are not awake for the procedure. Please see the instruction sheet given to you today.  Follow-Up: Your physician recommends that you schedule a follow-up appointment with Dr.Allred after echo (To be considered for an ablation)   Any Other Special Instructions Will Be Listed Below (If Applicable).

## 2014-11-20 NOTE — Telephone Encounter (Signed)
agree

## 2014-11-20 NOTE — Progress Notes (Signed)
11/20/2014 Justin Kidd   October 23, 1960  [id number]  Primary Physician [place], PA-C Primary Cardiologist: Justin Kidd  Reason for Vist/CC: Dyspnea and Palpitations  HPI:  Justin Kidd was added on the the Flex schedule as an add-on for complaints of dyspnea and palpitations. He is a 54 y/o male followed by Justin Kidd with a h/o PAF since age 65. He has been managed medically. He has never required cardioversion. He has been on chronic anticoagulation therapy with Xarelto. He was told by another provider that he had congestive heart failure several years ago. Most recent 2D echo 04/2012 demonstrated normal LVF with EF of 60% w/o WMA. There was no mention of any diastolic dysfunction. Due to difficulties controlling his atrial fibrillation, he was recently placed on Flecainide, 100 mg BID. F/U ETT 10/28/14 showed no evidence of ischemia and no exercise induced arrhthymias. He also underwent a sleep study recently and was diagnosed with OSA. He is waiting for his CAPA machine to be ordered.   He presents to clinic complaining of increased dyspnea and irregular palpitations, consistent with prior episodes of atrial fibrillation.  He reports full compliance with Flecainide, cardizem and Xarelto (no missed doses). He has avoided caffeine and alcohol. He denies CP.  EKG today demonstrates atrial fibrillation w/ a RVR of 117 bpm. BP is stable at 120/72. He is currently fairly asymptomatic in clinic.    Current Outpatient Prescriptions  Medication Sig Dispense Refill  . diltiazem (CARDIZEM) 60 MG tablet Take 1 tablet as directed as needed 120 tablet 0  . flecainide (TAMBOCOR) 100 MG tablet Take 1 tablet (100 mg total) by mouth 2 (two) times daily. 60 tablet 6  . furosemide (LASIX) 20 MG tablet Take 20 mg by mouth daily as needed for fluid.    Marland Kitchen gabapentin (NEURONTIN) 100 MG capsule Take 200 mg by mouth 3 (three) times daily.    . hydrOXYzine (VISTARIL) 25 MG capsule Take 25 mg by mouth as  needed. For anxiety  0  . methocarbamol (ROBAXIN) 750 MG tablet Take 750 mg by mouth as needed. For spasms  2  . oxyCODONE-acetaminophen (PERCOCET/ROXICET) 5-325 MG per tablet Take 1 tablet by mouth every 6 (six) hours as needed. Take one tablet by mouth every six hours as needed for pain  0  . Potassium Chloride ER 20 MEQ TBCR 1 TABLET DAILY BY MOUTH  ON THE DAYS YOU TAKE FUROSEMIDE(LASIX) 30 tablet 6  . rivaroxaban (XARELTO) 20 MG TABS tablet Take 1 tablet (20 mg total) by mouth daily with supper. 30 tablet 6  . rOPINIRole (REQUIP) 1 MG tablet Take 1 mg by mouth 3 (three) times daily.  1   No current facility-administered medications for this visit.    No Known Allergies  History   Social History  . Marital Status: Divorced    Spouse Name: N/A  . Number of Children: N/A  . Years of Education: N/A   Occupational History  . Not on file.   Social History Main Topics  . Smoking status: Never Smoker   . Smokeless tobacco: Never Used  . Alcohol Use: No  . Drug Use: No  . Sexual Activity: Not on file   Other Topics Concern  . Not on file   Social History Narrative     Review of Systems: General: negative for chills, fever, night sweats or weight changes.  Cardiovascular: negative for chest pain, dyspnea on exertion, edema, orthopnea, palpitations, paroxysmal nocturnal dyspnea or shortness of breath Dermatological: negative for  rash Respiratory: negative for cough or wheezing Urologic: negative for hematuria Abdominal: negative for nausea, vomiting, diarrhea, bright red blood per rectum, melena, or hematemesis Neurologic: negative for visual changes, syncope, or dizziness All other systems reviewed and are otherwise negative except as noted above.    Blood pressure 120/72, pulse 117, height 6\' 3"  (1.905 m), weight 318 lb 9.6 oz (144.516 kg).  General appearance: alert, cooperative and no distress Neck: no carotid bruit and no JVD Lungs: clear to auscultation  bilaterally Heart: irregularly irregular rhythm, tachy rate Extremities: no LEE Pulses: 2+ and symmetric Skin: warm and dry Neurologic: Grossly normal   EKG Atrial Fibrillation w/ RVR 117 bpm  ASSESSMENT AND PLAN:   1. Atrial Fibrillation w/ RVR:  He has failed Flecainide therapy. I have discussed case with Dr. Caryl Kidd, DOD and Electrophysiologist. He has recommended that he discontinue Flecainide. He will continue Cardizem 120 mg daily. We will set him up for OP cardioversion next week (has been fully compliant with Xarelto w/o any missed doses). He will start 225 mg of Rythmol BID 3 days prior to scheduled cardioversion. He is to continue Cardizem when he starts his new antiarrythmic. Continue Justin Kidd. We will plan for a 2D echo to assess left atrial dementions. I have recommended that he wait until after cardioversion as study will be of better quality if in NSR. He will be referred to Dr. Rayann Kidd for consideration for atrial fibrillation ablation.   2. Chronic Anticoagulation: no abnormal bleeding. Fully compliant. Continue Xarelto.   3. OSA: new diagnosis. CPAP ordered by sleep specialist. Patient waiting on device. He was encouraged to be compliant to reduce burden of atrial fibrillation.    Justin Kidd, BRITTAINYPA-C 11/20/2014 1:56 PM

## 2014-11-20 NOTE — Telephone Encounter (Signed)
Spoke with Justin Kidd and he states since starting Flecanide he has been having trouble breathing on a daily basis that starts 3-4 hours after taking medication. Justin Kidd states that he has had a couple of episodes of Afib since starting medication that has lasted about 6 hours. Justin Kidd states that mother has O2 at home and he uses it when he has trouble breathing and his sx improve. Justin Kidd denies any other symptoms. Spoke with Lyda Jester, PA-C and she wanted to have Justin Kidd come in today if possible to be seen. Spoke with Justin Kidd and made appt for today at 1:30pm with Lyda Jester. Justin Kidd verbalized understanding and was in agreement with this plan.

## 2014-11-21 ENCOUNTER — Telehealth: Payer: Self-pay | Admitting: Cardiology

## 2014-11-21 NOTE — Telephone Encounter (Signed)
Pt had successful PAP titration. Please setup appointment in 10 weeks. Please let [facility] know that order for PAP is in EPIC.   

## 2014-11-21 NOTE — Addendum Note (Signed)
Addended by: Fransico Him R on: 11/21/2014 10:55 PM   Modules accepted: Orders

## 2014-11-21 NOTE — Sleep Study (Signed)
   NAME: Justin Kidd DATE OF BIRTH:  September 16, 1960 MEDICAL RECORD NUMBER [id number]  LOCATION: [facility]  PHYSICIAN: TURNER,TRACI R  DATE OF STUDY: 11/17/2014  SLEEP STUDY TYPE: Positive Airway Pressure Titration               REFERRING PHYSICIAN: Sueanne Margarita, MD  INDICATION FOR STUDY: Obstructive sleep apnea  EPWORTH SLEEPINESS SCORE: 3 HEIGHT: 6\' 3"  (190.5 cm)  WEIGHT: (!) 305 lb (138.347 kg)    Body mass index is 38.12 kg/(m^2).  NECK SIZE: 18.5 in.  MEDICATIONS: Reviewed in the chart  SLEEP ARCHITECTURE: The patient slept for a total of 288 minutes out of a total sleep period of 353 mintues.  There was no slow wave sleep and only 1.5 minutes of REM sleep.  The onset to sleep latency was normal at 13 minutes.  The onset to REM sleep latency was prolonged at 223 minutes.  The sleep efficiency was reduced at 79%.    RESPIRATORY DATA: The patient was started on CPAP at 5cm H2O and titrated for respiratory events and snoring to 8cm H2O.  The patient was not able to reach REM supine sleep but was able to achieve optimum pressure of 8cm H2O without any further respiratory events.  The AHI was 0 at 8cm H2O.    OXYGEN DATA: The average oxygen saturation was 93%.  The lowest oxygen saturation was 87%.  The amount of time spent with oxygen saturations lower than 88% was 0.3 minutes.  CARDIAC DATA: The patient maintained NSR with PAC's. The average heart rate was 70 bpm.  The lowest heart rate was 43 bpm.    MOVEMENT/PARASOMNIA: There were a few periodic limb movements with a PLMS index of 0.8 movements per hour.  There were no REM sleep behavior disorders.  IMPRESSION/ RECOMMENDATION:   1.   Mild obstructive sleep apnea/hypopnea syndrome with an AHI of 10 events per hour with mild snoring and oxygen desaturations <88% for 10 minutes on diagnostic study. 2.  Successful CPAP titration to 8cm H2O.    The patient was not able to reach REM supine sleep but was able to  achieve optimum pressure of 8cm H2O without any further respiratory events.  The AHI was 0 at 8cm H2O.   3.  Occasional PAC's were noted. 4.  Snoring resolved with CPAP. 5.  No significant oxygen desaturations with CPAP. 6.  Recommend ResMed CPAP at 8cm H2O with [employer] Pilario Q mask with heated humidifier.  Signed:  Sueanne Margarita Diplomate, American Board of Sleep Medicine  ELECTRONICALLY SIGNED ON:  11/21/2014, 10:43 PM [place] PH: [phone]   FX: [phone] [place]

## 2014-11-23 ENCOUNTER — Encounter (HOSPITAL_COMMUNITY): Payer: Self-pay | Admitting: Anesthesiology

## 2014-11-23 NOTE — Anesthesia Preprocedure Evaluation (Deleted)
Anesthesia Evaluation Anesthesia Physical Anesthesia Plan Anesthesia Quick Evaluation  

## 2014-11-24 ENCOUNTER — Ambulatory Visit (HOSPITAL_COMMUNITY)
Admission: RE | Admit: 2014-11-24 | Discharge: 2014-11-24 | Disposition: A | Discharge status: Home / Self Care | Payer: 59 | Source: Ambulatory Visit | Attending: Cardiology | Admitting: Cardiology

## 2014-11-24 ENCOUNTER — Encounter (HOSPITAL_COMMUNITY)
Admission: RE | Disposition: A | Discharge status: Home / Self Care | Payer: Self-pay | Source: Ambulatory Visit | Attending: Cardiology

## 2014-11-24 DIAGNOSIS — Z79891 Long term (current) use of opiate analgesic: Secondary | ICD-10-CM | POA: Diagnosis not present

## 2014-11-24 DIAGNOSIS — Z7901 Long term (current) use of anticoagulants: Secondary | ICD-10-CM | POA: Diagnosis not present

## 2014-11-24 DIAGNOSIS — Z538 Procedure and treatment not carried out for other reasons: Secondary | ICD-10-CM | POA: Diagnosis not present

## 2014-11-24 DIAGNOSIS — G4733 Obstructive sleep apnea (adult) (pediatric): Secondary | ICD-10-CM | POA: Insufficient documentation

## 2014-11-24 DIAGNOSIS — Z79899 Other long term (current) drug therapy: Secondary | ICD-10-CM | POA: Insufficient documentation

## 2014-11-24 DIAGNOSIS — I4891 Unspecified atrial fibrillation: Secondary | ICD-10-CM | POA: Insufficient documentation

## 2014-11-24 DIAGNOSIS — Z9114 Patient's other noncompliance with medication regimen: Secondary | ICD-10-CM | POA: Insufficient documentation

## 2014-11-24 DIAGNOSIS — I509 Heart failure, unspecified: Secondary | ICD-10-CM | POA: Diagnosis not present

## 2014-11-24 SURGERY — CANCELLED PROCEDURE

## 2014-11-24 NOTE — H&P (View-Only) (Signed)
11/20/2014 Justin Kidd   12-Dec-1960  [id number]  Primary Physician [place], PA-C Primary Cardiologist: Justin Kidd  Reason for Vist/CC: Dyspnea and Palpitations  HPI:  Justin Kidd was added on the the Flex schedule as an add-on for complaints of dyspnea and palpitations. He is a 54 y/o male followed by Justin Kidd with a h/o PAF since age 54. He has been managed medically. He has never required cardioversion. He has been on chronic anticoagulation therapy with Xarelto. He was told by another provider that he had congestive heart failure several years ago. Most recent 2D echo 04/2012 demonstrated normal LVF with EF of 60% w/o WMA. There was no mention of any diastolic dysfunction. Due to difficulties controlling his atrial fibrillation, he was recently placed on Flecainide, 100 mg BID. F/U ETT 10/28/14 showed no evidence of ischemia and no exercise induced arrhthymias. He also underwent a sleep study recently and was diagnosed with OSA. He is waiting for his CAPA machine to be ordered.   He presents to clinic complaining of increased dyspnea and irregular palpitations, consistent with prior episodes of atrial fibrillation.  He reports full compliance with Flecainide, cardizem and Xarelto (no missed doses). He has avoided caffeine and alcohol. He denies CP.  EKG today demonstrates atrial fibrillation w/ a RVR of 117 bpm. BP is stable at 120/72. He is currently fairly asymptomatic in clinic.    Current Outpatient Prescriptions  Medication Sig Dispense Refill  . diltiazem (CARDIZEM) 60 MG tablet Take 1 tablet as directed as needed 120 tablet 0  . flecainide (TAMBOCOR) 100 MG tablet Take 1 tablet (100 mg total) by mouth 2 (two) times daily. 60 tablet 6  . furosemide (LASIX) 20 MG tablet Take 20 mg by mouth daily as needed for fluid.    Marland Kitchen gabapentin (NEURONTIN) 100 MG capsule Take 200 mg by mouth 3 (three) times daily.    . hydrOXYzine (VISTARIL) 25 MG capsule Take 25 mg by mouth as  needed. For anxiety  0  . methocarbamol (ROBAXIN) 750 MG tablet Take 750 mg by mouth as needed. For spasms  2  . oxyCODONE-acetaminophen (PERCOCET/ROXICET) 5-325 MG per tablet Take 1 tablet by mouth every 6 (six) hours as needed. Take one tablet by mouth every six hours as needed for pain  0  . Potassium Chloride ER 20 MEQ TBCR 1 TABLET DAILY BY MOUTH  ON THE DAYS YOU TAKE FUROSEMIDE(LASIX) 30 tablet 6  . rivaroxaban (XARELTO) 20 MG TABS tablet Take 1 tablet (20 mg total) by mouth daily with supper. 30 tablet 6  . rOPINIRole (REQUIP) 1 MG tablet Take 1 mg by mouth 3 (three) times daily.  1   No current facility-administered medications for this visit.    No Known Allergies  History   Social History  . Marital Status: Divorced    Spouse Name: N/A  . Number of Children: N/A  . Years of Education: N/A   Occupational History  . Not on file.   Social History Main Topics  . Smoking status: Never Smoker   . Smokeless tobacco: Never Used  . Alcohol Use: No  . Drug Use: No  . Sexual Activity: Not on file   Other Topics Concern  . Not on file   Social History Narrative     Review of Systems: General: negative for chills, fever, night sweats or weight changes.  Cardiovascular: negative for chest pain, dyspnea on exertion, edema, orthopnea, palpitations, paroxysmal nocturnal dyspnea or shortness of breath Dermatological: negative for  rash Respiratory: negative for cough or wheezing Urologic: negative for hematuria Abdominal: negative for nausea, vomiting, diarrhea, bright red blood per rectum, melena, or hematemesis Neurologic: negative for visual changes, syncope, or dizziness All other systems reviewed and are otherwise negative except as noted above.    Blood pressure 120/72, pulse 117, height 6\' 3"  (1.905 m), weight 318 lb 9.6 oz (144.516 kg).  General appearance: alert, cooperative and no distress Neck: no carotid bruit and no JVD Lungs: clear to auscultation  bilaterally Heart: irregularly irregular rhythm, tachy rate Extremities: no LEE Pulses: 2+ and symmetric Skin: warm and dry Neurologic: Grossly normal   EKG Atrial Fibrillation w/ RVR 117 bpm  ASSESSMENT AND PLAN:   1. Atrial Fibrillation w/ RVR:  He has failed Flecainide therapy. I have discussed case with Justin Kidd, DOD and Electrophysiologist. He has recommended that he discontinue Flecainide. He will continue Cardizem 120 mg daily. We will set him up for OP cardioversion next week (has been fully compliant with Xarelto w/o any missed doses). He will start 225 mg of Rythmol BID 3 days prior to scheduled cardioversion. He is to continue Cardizem when he starts his new antiarrythmic. Continue Justin Kidd. We will plan for a 2D echo to assess left atrial dementions. I have recommended that he wait until after cardioversion as study will be of better quality if in NSR. He will be referred to Dr. Rayann Kidd for consideration for atrial fibrillation ablation.   2. Chronic Anticoagulation: no abnormal bleeding. Fully compliant. Continue Xarelto.   3. OSA: new diagnosis. CPAP ordered by sleep specialist. Patient waiting on device. He was encouraged to be compliant to reduce burden of atrial fibrillation.    Justin Kidd, Justin Kidd 11/20/2014 1:56 PM

## 2014-11-24 NOTE — CV Procedure (Signed)
Patient arrived in the endoscopy suite and was in NSR by EKG.  He will followup with Dr. Irish Lack 12/22/2014

## 2014-11-24 NOTE — Telephone Encounter (Signed)
Patient is aware of results. Follow-up appt is made for 7/12  8am Message has been sent to [facility]

## 2014-11-24 NOTE — Interval H&P Note (Signed)
History and Physical Interval Note:  11/24/2014 4:43 PM  [name]  has presented today for surgery, with the diagnosis of afib  The various methods of treatment have been discussed with the patient and family. After consideration of risks, benefits and other options for treatment, the patient has consented to  Procedure(s): CANCELLED PROCEDURE as a surgical intervention .  The patient's history has been reviewed, patient examined, no change in status, stable for surgery.  I have reviewed the patient's chart and labs.  Questions were answered to the patient's satisfaction.     TURNER,TRACI R

## 2014-12-01 ENCOUNTER — Other Ambulatory Visit: Payer: Self-pay | Admitting: Interventional Cardiology

## 2014-12-03 ENCOUNTER — Ambulatory Visit (HOSPITAL_COMMUNITY): Payer: 59 | Attending: Cardiovascular Disease

## 2014-12-03 ENCOUNTER — Other Ambulatory Visit: Payer: Self-pay

## 2014-12-03 DIAGNOSIS — I481 Persistent atrial fibrillation: Secondary | ICD-10-CM | POA: Diagnosis present

## 2014-12-03 DIAGNOSIS — I4891 Unspecified atrial fibrillation: Secondary | ICD-10-CM

## 2014-12-09 ENCOUNTER — Telehealth: Payer: Self-pay

## 2014-12-09 NOTE — Telephone Encounter (Signed)
-----   Message from Consuelo Pandy, [place] sent at 12/05/2014  5:07 PM EDT ----- Pump function is normal. Dr. Rayann Heman will review results to see if good ablation candidate.

## 2014-12-09 NOTE — Telephone Encounter (Signed)
Pt aware of echo results. Pump function is normal. Dr. Rayann Heman will review results to see if good ablation candidate. Pt verbalized understanding.

## 2014-12-10 ENCOUNTER — Telehealth: Payer: Self-pay | Admitting: Cardiology

## 2014-12-10 ENCOUNTER — Encounter: Payer: Self-pay | Admitting: Cardiology

## 2014-12-10 NOTE — Telephone Encounter (Signed)
Pt states the C-pap he has is only air that is coming out. Pt states he needs for his C-pap to have oxygen, because he feels like he can't breath when he has it on him during the night. Pt is aware that this message will be send to MD for recommendations.

## 2014-12-10 NOTE — Telephone Encounter (Signed)
New Prob   Pt states he is not getting enough oxygen through his CPAP machine and would like to discuss other possible options. Please call.

## 2014-12-10 NOTE — Telephone Encounter (Signed)
He does not qualify for Oxygen - he did not have any significant O2 desats with CPAP.  Please get a download from DME

## 2014-12-11 ENCOUNTER — Other Ambulatory Visit: Payer: Self-pay

## 2014-12-11 NOTE — Telephone Encounter (Signed)
Download received and given to Dr. Radford Pax for review.

## 2014-12-15 ENCOUNTER — Other Ambulatory Visit: Payer: Self-pay

## 2014-12-15 ENCOUNTER — Ambulatory Visit (INDEPENDENT_AMBULATORY_CARE_PROVIDER_SITE_OTHER): Payer: 59 | Admitting: Internal Medicine

## 2014-12-15 ENCOUNTER — Encounter: Payer: Self-pay | Admitting: Internal Medicine

## 2014-12-15 VITALS — BP 130/68 | HR 84 | Ht 75.0 in | Wt 326.6 lb

## 2014-12-15 DIAGNOSIS — I480 Paroxysmal atrial fibrillation: Secondary | ICD-10-CM | POA: Diagnosis not present

## 2014-12-15 DIAGNOSIS — E669 Obesity, unspecified: Secondary | ICD-10-CM | POA: Diagnosis not present

## 2014-12-15 DIAGNOSIS — G4710 Hypersomnia, unspecified: Secondary | ICD-10-CM | POA: Diagnosis not present

## 2014-12-15 DIAGNOSIS — R400 Somnolence: Secondary | ICD-10-CM

## 2014-12-15 DIAGNOSIS — G4733 Obstructive sleep apnea (adult) (pediatric): Secondary | ICD-10-CM | POA: Diagnosis not present

## 2014-12-15 LAB — CBC WITH DIFFERENTIAL/PLATELET
Basophils Absolute: 0 10*3/uL (ref 0.0–0.1)
Basophils Relative: 0.4 % (ref 0.0–3.0)
Eosinophils Absolute: 0.2 10*3/uL (ref 0.0–0.7)
Eosinophils Relative: 2.2 % (ref 0.0–5.0)
HCT: 42.5 % (ref 39.0–52.0)
HEMOGLOBIN: 14.4 g/dL (ref 13.0–17.0)
LYMPHS PCT: 32.6 % (ref 12.0–46.0)
Lymphs Abs: 2.6 10*3/uL (ref 0.7–4.0)
MCHC: 34 g/dL (ref 30.0–36.0)
MCV: 87.9 fl (ref 78.0–100.0)
Monocytes Absolute: 0.7 10*3/uL (ref 0.1–1.0)
Monocytes Relative: 8.6 % (ref 3.0–12.0)
Neutro Abs: 4.5 10*3/uL (ref 1.4–7.7)
Neutrophils Relative %: 56.2 % (ref 43.0–77.0)
PLATELETS: 232 10*3/uL (ref 150.0–400.0)
RBC: 4.83 Mil/uL (ref 4.22–5.81)
RDW: 13.6 % (ref 11.5–15.5)
WBC: 8.1 10*3/uL (ref 4.0–10.5)

## 2014-12-15 LAB — BASIC METABOLIC PANEL
BUN: 12 mg/dL (ref 6–23)
CALCIUM: 8.9 mg/dL (ref 8.4–10.5)
CHLORIDE: 104 meq/L (ref 96–112)
CO2: 30 mEq/L (ref 19–32)
CREATININE: 0.82 mg/dL (ref 0.40–1.50)
GFR: 104.49 mL/min (ref >60.00)
Glucose, Bld: 128 mg/dL — ABNORMAL HIGH (ref 70–99)
Potassium: 3.8 mEq/L (ref 3.5–5.1)
Sodium: 138 mEq/L (ref 135–145)

## 2014-12-15 NOTE — Telephone Encounter (Signed)
Please reassure patient that the device is working and that it completely resolves his OSA when he is using it.

## 2014-12-15 NOTE — Telephone Encounter (Signed)
Reassured patient that the device is working and that it completely resolves OSA when he uses it.  Patient st it may just be taking a long time for him to get used to the device. He agrees to try to use it more often.

## 2014-12-15 NOTE — Progress Notes (Signed)
Electrophysiology Office Note   Date:  12/15/2014   ID:  Justin Kidd, DOB 09-29-1960, MRN [id number]  PCP:  REDMON,NOELLE, PA-C  Cardiologist:  Dr Irish Lack Primary Electrophysiologist: Thompson Grayer, MD    Chief Complaint  Patient presents with  . Atrial Fibrillation     History of Present Illness: Justin Kidd is a 54 y.o. male who presents today for electrophysiology evaluation.   He reports initially being diagnosed with atrial fibrillation 21 years ago after presenting with symptoms of palpitations and weakness to an urgent clinic.  Episodes of atrial fibrillation have increased in frequency and duration since that time.  He was treated initially with diltiazem and coumadin.  He was placed on flecainide several months ago due to worsening afib.  He also thinks that he was having "breathing problems" with flecainide.   He has since failed medical therapy with flecainide and has been placed on propafenone by [name].  He was referred for cardioversion but he had converted to sinus rhythm by the time that he arrived at [facility].  Today, he is back in afib.  He has previously found that caffeine is a trigger for afib.  He has been evaluated and diagnosed with OSA.  He is currently working on CPAP titration.  During afib, he reports fatigue, palpitations, and dizziness when he stands.  His exercise tolerance is reduced and he feels "like I have run a marathon".  He is now on xarelto and is tolerating this well.  Today, he denies symptoms of palpitations, chest pain,  orthopnea, PND, lower extremity edema, claudication, presyncope, syncope, bleeding, or neurologic sequela. The patient is tolerating medications without difficulties and is otherwise without complaint today.    Past Medical History  Diagnosis Date  . Atrial fibrillation   . Pneumonia   . Anxiety   . Kidney stone   . Obstructive sleep apnea     still titrating CPAP  . Overweight   . DDD (degenerative disc disease),  cervical    Past Surgical History  Procedure Laterality Date  . Shoulder surgery    . Ankle surgery    . Wrist surgery    . Shoulder arthroscopy with open rotator cuff repair Bilateral   . Ankle surgery Right   . Wrist surgery Right      Current Outpatient Prescriptions  Medication Sig Dispense Refill  . diltiazem (CARDIZEM) 60 MG tablet Take 1 tablet as directed as needed (Patient taking differently: Take 60 mg by mouth as needed (AFIB). Take 1 tablet as directed as needed) 120 tablet 0  . furosemide (LASIX) 20 MG tablet Take 20 mg by mouth daily as needed for fluid.    Marland Kitchen gabapentin (NEURONTIN) 300 MG capsule Take 1 capsule by mouth 3 (three) times daily.    . hydrOXYzine (VISTARIL) 25 MG capsule Take 25 mg by mouth every 6 (six) hours as needed for anxiety. For anxiety  0  . methocarbamol (ROBAXIN) 750 MG tablet Take 750 mg by mouth every 8 (eight) hours as needed for muscle spasms.   2  . oxyCODONE-acetaminophen (PERCOCET/ROXICET) 5-325 MG per tablet Take 1 tablet by mouth every 6 (six) hours as needed for moderate pain.   0  . Potassium Chloride ER 20 MEQ TBCR 1 TABLET DAILY BY MOUTH  ON THE DAYS YOU TAKE FUROSEMIDE(LASIX) 30 tablet 6  . propafenone (RYTHMOL) 225 MG tablet Take 1 tablet (225 mg total) by mouth 2 (two) times daily. Start taking on 11/21/14 60 tablet 2  .  rOPINIRole (REQUIP) 1 MG tablet Take 1 mg by mouth 3 (three) times daily.  1  . XARELTO 20 MG TABS tablet TAKE 1 TABLET BY MOUTH DAILY WITH SUPPER 30 tablet 1   No current facility-administered medications for this visit.    Allergies:   Review of patient's allergies indicates no known allergies.   Social History:  The patient  reports that he has never smoked. He has never used smokeless tobacco. He reports that he does not drink alcohol or use illicit drugs.   Family History:  The patient's  family history includes Cancer in his paternal grandmother; Heart attack (age of onset: 63) in his mother; Heart failure in  his mother; Stroke in his other.    ROS:  Please see the history of present illness.   All other systems are reviewed and negative.    PHYSICAL EXAM: VS:  BP 130/68 mmHg  Pulse 84  Ht 6\' 3"  (1.905 m)  Wt 326 lb 9.6 oz (148.145 kg)  BMI 40.82 kg/m2 , BMI Body mass index is 40.82 kg/(m^2). GEN: overweight, in no acute distress HEENT: normal Neck: no JVD, carotid bruits, or masses Cardiac: iRRR; no murmurs, rubs, or gallops,no edema  Respiratory:  clear to auscultation bilaterally, normal work of breathing GI: soft, nontender, nondistended, + BS MS: no deformity or atrophy Skin: warm and dry  Neuro:  Strength and sensation are intact Psych: euthymic mood, full affect  EKG:  EKG is ordered today. The ekg ordered today shows afib, V rate 84 bpm,    Recent Labs: 05/10/2014: ALT 14 09/08/2014: Hemoglobin 14.7; Platelets 249.0 11/20/2014: BUN 11; Creatinine 0.99; Potassium 4.3; Sodium 137    Lipid Panel  No results found for: CHOL, TRIG, HDL, CHOLHDL, VLDL, LDLCALC, LDLDIRECT   Wt Readings from Last 3 Encounters:  12/15/14 326 lb 9.6 oz (148.145 kg)  11/20/14 318 lb 9.6 oz (144.516 kg)  11/17/14 305 lb (138.347 kg)      Other studies Reviewed: Additional studies/ records that were reviewed today include: [place] [facility] notes, echo  Review of the above records today demonstrates: preserved EF, no significant valvular disease, LA 75mm   ASSESSMENT AND PLAN:  1.  Paroxysmal atrial fibrillation The patient has symptomatic recurrent afib.  He has failed medical therapy with flecainde and propafenone.   Therapeutic strategies for afib including medicine and ablation were discussed in detail with the patient today. Risk, benefits, and alternatives to EP study and radiofrequency ablation for afib were also discussed in detail today. These risks include but are not limited to stroke, bleeding, vascular damage, tamponade, perforation, damage to the esophagus, lungs, and other  structures, pulmonary vein stenosis, worsening renal function, and death. The patient understands these risk and wishes to proceed.  We will therefore proceed with catheter ablation at the next available time.  He will require TEE prior to the procedure.  chads2vasc score is 0.  2. obesity Body mass index is 40.82 kg/(m^2). I have reviewed the patients BMI and decreased success rates with ablation at length today.  Weight loss is strongly advised.  Per Guijian et al (PACE 2013; 36: 630-160), patients with BMI 25-29.9 (obese) have a 27% increase in AF recurrence post ablation.  Patients with BMI >30 have a 31% increase in AF recurrence post ablation when compared to those with BMI <25.  3. OSA Compliance with CPAP is encouraged  Current medicines are reviewed at length with the patient today.   The patient does not have concerns regarding  his medicines.  The following changes were made today:  none  Signed, Thompson Grayer, MD  12/15/2014 9:00 AM     [facility] [address] [place] [address] [phone] (office) [phone] (fax)

## 2014-12-15 NOTE — Telephone Encounter (Signed)
Per Dr. Radford Pax, informed patient that AHI is normal on 8 cm H2O and instructed the patient to be more compliant. Patient st it does not feel like enough air is blowing out when he uses the machine, so it is difficult to use.  He requests to try a change so it is easier to use.

## 2014-12-15 NOTE — Patient Instructions (Signed)
Medication Instructions:  Your physician recommends that you continue on your current medications as directed. Please refer to the Current Medication list given to you today.   Labwork: Your physician recommends that you return for lab work TODAY (CBC, BMET)   Testing/Procedures: NONE  Follow-Up: Your physician recommends that you schedule a follow-up appointment in: 4 weeks from 5/26 with Roderic Palau, in [facility]  Your physician recommends that you schedule a follow-up appointment in: 3 months with Dr. Rayann Heman.   Any Other Special Instructions Will Be Listed Below (If Applicable).

## 2014-12-22 ENCOUNTER — Ambulatory Visit (INDEPENDENT_AMBULATORY_CARE_PROVIDER_SITE_OTHER): Payer: 59 | Admitting: Interventional Cardiology

## 2014-12-22 ENCOUNTER — Encounter: Payer: Self-pay | Admitting: Interventional Cardiology

## 2014-12-22 VITALS — BP 118/76 | Ht 75.0 in | Wt 322.0 lb

## 2014-12-22 DIAGNOSIS — I480 Paroxysmal atrial fibrillation: Secondary | ICD-10-CM

## 2014-12-22 DIAGNOSIS — G4733 Obstructive sleep apnea (adult) (pediatric): Secondary | ICD-10-CM | POA: Diagnosis not present

## 2014-12-22 DIAGNOSIS — E669 Obesity, unspecified: Secondary | ICD-10-CM | POA: Diagnosis not present

## 2014-12-22 NOTE — Patient Instructions (Signed)
Medication Instructions:  Same  Labwork: None  Testing/Procedures: None  Follow-Up: To be determined after Ablation.

## 2014-12-22 NOTE — Progress Notes (Signed)
Patient ID: Justin Kidd, male   DOB: 26-Apr-1961, 54 y.o.   MRN: [id number]     Cardiology Office Note   Date:  12/22/2014   ID:  Justin Kidd, DOB 1960-10-26, MRN [id number]  PCP:  REDMON,NOELLE, PA-C    No chief complaint on file.    Wt Readings from Last 3 Encounters:  12/22/14 322 lb (146.058 kg)  12/15/14 326 lb 9.6 oz (148.145 kg)  11/20/14 318 lb 9.6 oz (144.516 kg)       History of Present Illness: Justin Kidd is a 54 y.o. male  with a h/o PAF since age 25. He has been managed medically. He has never required cardioversion. He has been on chronic anticoagulation therapy with Xarelto.  Most recent 2D echo 04/2012 demonstrated normal LVF with EF of 60% w/o WMA.  Due to difficulties controlling his atrial fibrillation, he was recently placed on Flecainide, 100 mg BID. F/U ETT 10/28/14 showed no evidence of ischemia and no exercise induced arrhthymias. He also underwent a sleep study recently and was diagnosed with OSA. He is waiting for his CPAP machine to be ordered.  He had recurrent symptoms and flexion was changed to Prograf and on. He continues to have recurrent atrial fibrillation. He is compliant with his medication. He is compliant with his CPap as well.  He was evaluated by electrophysiology. He is now scheduled for a TEE with ablation.    Past Medical History  Diagnosis Date  . Atrial fibrillation   . Pneumonia   . Anxiety   . Kidney stone   . Obstructive sleep apnea     still titrating CPAP  . Overweight   . DDD (degenerative disc disease), cervical     Past Surgical History  Procedure Laterality Date  . Shoulder surgery    . Ankle surgery    . Wrist surgery    . Shoulder arthroscopy with open rotator cuff repair Bilateral   . Ankle surgery Right   . Wrist surgery Right      Current Outpatient Prescriptions  Medication Sig Dispense Refill  . diltiazem (CARDIZEM) 60 MG tablet Take 1 tablet as directed as needed (Patient taking differently: Take 60 mg  by mouth as needed (AFIB). Take 1 tablet as directed as needed) 120 tablet 0  . furosemide (LASIX) 20 MG tablet Take 20 mg by mouth daily as needed for fluid.    Marland Kitchen gabapentin (NEURONTIN) 300 MG capsule Take 1 capsule by mouth 3 (three) times daily.    . hydrOXYzine (VISTARIL) 25 MG capsule Take 25 mg by mouth every 6 (six) hours as needed for anxiety. For anxiety  0  . methocarbamol (ROBAXIN) 750 MG tablet Take 750 mg by mouth every 8 (eight) hours as needed for muscle spasms.   2  . oxyCODONE-acetaminophen (PERCOCET/ROXICET) 5-325 MG per tablet Take 1 tablet by mouth every 6 (six) hours as needed for moderate pain.   0  . Potassium Chloride ER 20 MEQ TBCR 1 TABLET DAILY BY MOUTH  ON THE DAYS YOU TAKE FUROSEMIDE(LASIX) 30 tablet 6  . propafenone (RYTHMOL) 225 MG tablet Take 1 tablet (225 mg total) by mouth 2 (two) times daily. Start taking on 11/21/14 60 tablet 2  . rOPINIRole (REQUIP) 1 MG tablet Take 1 mg by mouth 3 (three) times daily.  1  . XARELTO 20 MG TABS tablet TAKE 1 TABLET BY MOUTH DAILY WITH SUPPER 30 tablet 1   No current facility-administered medications for this visit.  Allergies:   Review of patient's allergies indicates no known allergies.    Social History:  The patient  reports that he has never smoked. He has never used smokeless tobacco. He reports that he does not drink alcohol or use illicit drugs.   Family History:  The patient's family history includes Cancer in his paternal grandmother; Heart attack (age of onset: 33) in his mother; Heart failure in his mother; Stroke in his other.    ROS:  Please see the history of present illness.   Otherwise, review of systems are positive for palpitations.   All other systems are reviewed and negative.    PHYSICAL EXAM: VS:  BP 118/76 mmHg  Ht 6\' 3"  (1.905 m)  Wt 322 lb (146.058 kg)  BMI 40.25 kg/m2 , BMI Body mass index is 40.25 kg/(m^2). GEN: Well nourished, well developed, in no acute distress HEENT: normal Neck: no  JVD, carotid bruits, or masses Cardiac: RRR; no murmurs, rubs, or gallops,no edema  Respiratory:  clear to auscultation bilaterally, normal work of breathing GI: soft, nontender, nondistended, + BS MS: no deformity or atrophy Skin: warm and dry, no rash Neuro:  Strength and sensation are intact Psych: euthymic mood, full affect     Recent Labs: 05/10/2014: ALT 14 12/15/2014: BUN 12; Creatinine 0.82; Hemoglobin 14.4; Platelets 232.0; Potassium 3.8; Sodium 138   Lipid Panel No results found for: CHOL, TRIG, HDL, CHOLHDL, VLDL, LDLCALC, LDLDIRECT   Other studies Reviewed: Additional studies/ records that were reviewed today with results demonstrating: Was in NSR at time of scheduled cardoversion.   ASSESSMENT AND PLAN:  1. Atrial fibrillation: Continue current medicines. Plan for ablation.  Further plans based on ablation result. 2. Obesity: We stressed the importance of weight loss. 3. Sleep apnea: Continue with treatment with CPAP.   Current medicines are reviewed at length with the patient today.  The patient concerns regarding his medicines were addressed.  The following changes have been made:  No change  Labs/ tests ordered today include:  No orders of the defined types were placed in this encounter.    Recommend 150 minutes/week of aerobic exercise Low fat, low carb, high fiber diet recommended  Disposition:   FU in post ablation   Teresita Madura., MD  12/22/2014 9:17 AM    [facility] [place], [place], [place]  [address] Phone: [phone]; Fax: [phone]

## 2014-12-24 ENCOUNTER — Ambulatory Visit (HOSPITAL_COMMUNITY): Payer: 59

## 2014-12-24 ENCOUNTER — Encounter (HOSPITAL_COMMUNITY): Payer: Self-pay

## 2014-12-24 ENCOUNTER — Ambulatory Visit (HOSPITAL_COMMUNITY)
Admission: RE | Admit: 2014-12-24 | Discharge: 2014-12-24 | Disposition: A | Discharge status: Home / Self Care | Payer: 59 | Source: Ambulatory Visit | Attending: Internal Medicine | Admitting: Internal Medicine

## 2014-12-24 ENCOUNTER — Encounter (HOSPITAL_COMMUNITY)
Admission: RE | Disposition: A | Discharge status: Home / Self Care | Payer: Self-pay | Source: Ambulatory Visit | Attending: Internal Medicine

## 2014-12-24 DIAGNOSIS — F419 Anxiety disorder, unspecified: Secondary | ICD-10-CM | POA: Diagnosis not present

## 2014-12-24 DIAGNOSIS — M5030 Other cervical disc degeneration, unspecified cervical region: Secondary | ICD-10-CM | POA: Insufficient documentation

## 2014-12-24 DIAGNOSIS — I480 Paroxysmal atrial fibrillation: Secondary | ICD-10-CM

## 2014-12-24 DIAGNOSIS — Z7901 Long term (current) use of anticoagulants: Secondary | ICD-10-CM | POA: Diagnosis not present

## 2014-12-24 DIAGNOSIS — Z87442 Personal history of urinary calculi: Secondary | ICD-10-CM | POA: Diagnosis not present

## 2014-12-24 DIAGNOSIS — Z8701 Personal history of pneumonia (recurrent): Secondary | ICD-10-CM | POA: Insufficient documentation

## 2014-12-24 DIAGNOSIS — I4891 Unspecified atrial fibrillation: Secondary | ICD-10-CM

## 2014-12-24 DIAGNOSIS — G4733 Obstructive sleep apnea (adult) (pediatric): Secondary | ICD-10-CM | POA: Diagnosis not present

## 2014-12-24 DIAGNOSIS — Z6841 Body Mass Index (BMI) 40.0 and over, adult: Secondary | ICD-10-CM | POA: Diagnosis not present

## 2014-12-24 DIAGNOSIS — E669 Obesity, unspecified: Secondary | ICD-10-CM | POA: Insufficient documentation

## 2014-12-24 DIAGNOSIS — Z79899 Other long term (current) drug therapy: Secondary | ICD-10-CM | POA: Insufficient documentation

## 2014-12-24 HISTORY — PX: TEE WITHOUT CARDIOVERSION: SHX5443

## 2014-12-24 SURGERY — ECHOCARDIOGRAM, TRANSESOPHAGEAL
Anesthesia: Moderate Sedation

## 2014-12-24 MED ORDER — MIDAZOLAM HCL 10 MG/2ML IJ SOLN
INTRAMUSCULAR | Status: DC | PRN
  Administered 2014-12-24 (×3): 2 mg via INTRAVENOUS

## 2014-12-24 MED ORDER — FENTANYL CITRATE (PF) 100 MCG/2ML IJ SOLN
INTRAMUSCULAR | Status: AC
  Filled 2014-12-24: qty 2

## 2014-12-24 MED ORDER — MIDAZOLAM HCL 5 MG/ML IJ SOLN
INTRAMUSCULAR | Status: AC
  Filled 2014-12-24: qty 2

## 2014-12-24 MED ORDER — DIPHENHYDRAMINE HCL 50 MG/ML IJ SOLN
INTRAMUSCULAR | Status: AC
  Filled 2014-12-24: qty 1

## 2014-12-24 MED ORDER — FENTANYL CITRATE (PF) 100 MCG/2ML IJ SOLN
INTRAMUSCULAR | Status: DC | PRN
  Administered 2014-12-24 (×3): 25 ug via INTRAVENOUS

## 2014-12-24 MED ORDER — SODIUM CHLORIDE 0.9 % IV SOLN
INTRAVENOUS | Status: DC
  Administered 2014-12-24: 500 mL via INTRAVENOUS

## 2014-12-24 MED ORDER — BUTAMBEN-TETRACAINE-BENZOCAINE 2-2-14 % EX AERO
INHALATION_SPRAY | CUTANEOUS | Status: DC | PRN
  Administered 2014-12-24: 2 via TOPICAL

## 2014-12-24 NOTE — CV Procedure (Signed)
     Transesophageal Echocardiogram Note  Justin Kidd [id number] 05-Aug-1960  Procedure: Transesophageal Echocardiogram Indications: atrial fibrillation, pre ablation  Procedure Details Consent: Obtained Time Out: Verified patient identification, verified procedure, site/side was marked, verified correct patient position, special equipment/implants available, Radiology Safety Procedures followed,  medications/allergies/relevent history reviewed, required imaging and test results available.  Performed  Medications: Fentanyl: 75 mcg Versed: 6 mg  Left Ventrical:  Normal size, mild concentric LVH.  Mitral Valve: Trivial MR.   Aortic Valve: No AI.  Tricuspid Valve: Mild TR.   Pulmonic Valve: Trivial TR.  Left Atrium/ Left atrial appendage: Normal emptying velocities. No thrombus in LA or LAA.   Atrial septum: No PFO or ASD.   Aorta: Mild non-mobile plaque.   Complications: No apparent complications Patient did tolerate procedure well.  [name], MD, [facility] 12/24/2014, 8:55 AM

## 2014-12-24 NOTE — Interval H&P Note (Signed)
History and Physical Interval Note:  12/24/2014 8:54 AM  Justin Kidd  has presented today for surgery, with the diagnosis of afib  The various methods of treatment have been discussed with the patient and family. After consideration of risks, benefits and other options for treatment, the patient has consented to  Procedure(s): TRANSESOPHAGEAL ECHOCARDIOGRAM (TEE) (N/A) as a surgical intervention .  The patient's history has been reviewed, patient examined, no change in status, stable for surgery.  I have reviewed the patient's chart and labs.  Questions were answered to the patient's satisfaction.     [name]

## 2014-12-24 NOTE — H&P (View-Only) (Signed)
Patient ID: Justin Kidd, male   DOB: 11-18-1960, 54 y.o.   MRN: [id number]     Cardiology Office Note   Date:  12/22/2014   ID:  Justin Kidd, DOB 1960-10-18, MRN [id number]  PCP:  REDMON,NOELLE, PA-C    No chief complaint on file.    Wt Readings from Last 3 Encounters:  12/22/14 322 lb (146.058 kg)  12/15/14 326 lb 9.6 oz (148.145 kg)  11/20/14 318 lb 9.6 oz (144.516 kg)       History of Present Illness: Justin Kidd is a 54 y.o. male  with a h/o PAF since age 7. He has been managed medically. He has never required cardioversion. He has been on chronic anticoagulation therapy with Xarelto.  Most recent 2D echo 04/2012 demonstrated normal LVF with EF of 60% w/o WMA.  Due to difficulties controlling his atrial fibrillation, he was recently placed on Flecainide, 100 mg BID. F/U ETT 10/28/14 showed no evidence of ischemia and no exercise induced arrhthymias. He also underwent a sleep study recently and was diagnosed with OSA. He is waiting for his CPAP machine to be ordered.  He had recurrent symptoms and flexion was changed to Prograf and on. He continues to have recurrent atrial fibrillation. He is compliant with his medication. He is compliant with his CPap as well.  He was evaluated by electrophysiology. He is now scheduled for a TEE with ablation.    Past Medical History  Diagnosis Date  . Atrial fibrillation   . Pneumonia   . Anxiety   . Kidney stone   . Obstructive sleep apnea     still titrating CPAP  . Overweight   . DDD (degenerative disc disease), cervical     Past Surgical History  Procedure Laterality Date  . Shoulder surgery    . Ankle surgery    . Wrist surgery    . Shoulder arthroscopy with open rotator cuff repair Bilateral   . Ankle surgery Right   . Wrist surgery Right      Current Outpatient Prescriptions  Medication Sig Dispense Refill  . diltiazem (CARDIZEM) 60 MG tablet Take 1 tablet as directed as needed (Patient taking differently: Take 60 mg  by mouth as needed (AFIB). Take 1 tablet as directed as needed) 120 tablet 0  . furosemide (LASIX) 20 MG tablet Take 20 mg by mouth daily as needed for fluid.    Marland Kitchen gabapentin (NEURONTIN) 300 MG capsule Take 1 capsule by mouth 3 (three) times daily.    . hydrOXYzine (VISTARIL) 25 MG capsule Take 25 mg by mouth every 6 (six) hours as needed for anxiety. For anxiety  0  . methocarbamol (ROBAXIN) 750 MG tablet Take 750 mg by mouth every 8 (eight) hours as needed for muscle spasms.   2  . oxyCODONE-acetaminophen (PERCOCET/ROXICET) 5-325 MG per tablet Take 1 tablet by mouth every 6 (six) hours as needed for moderate pain.   0  . Potassium Chloride ER 20 MEQ TBCR 1 TABLET DAILY BY MOUTH  ON THE DAYS YOU TAKE FUROSEMIDE(LASIX) 30 tablet 6  . propafenone (RYTHMOL) 225 MG tablet Take 1 tablet (225 mg total) by mouth 2 (two) times daily. Start taking on 11/21/14 60 tablet 2  . rOPINIRole (REQUIP) 1 MG tablet Take 1 mg by mouth 3 (three) times daily.  1  . XARELTO 20 MG TABS tablet TAKE 1 TABLET BY MOUTH DAILY WITH SUPPER 30 tablet 1   No current facility-administered medications for this visit.  Allergies:   Review of patient's allergies indicates no known allergies.    Social History:  The patient  reports that he has never smoked. He has never used smokeless tobacco. He reports that he does not drink alcohol or use illicit drugs.   Family History:  The patient's family history includes Cancer in his paternal grandmother; Heart attack (age of onset: 3) in his mother; Heart failure in his mother; Stroke in his other.    ROS:  Please see the history of present illness.   Otherwise, review of systems are positive for palpitations.   All other systems are reviewed and negative.    PHYSICAL EXAM: VS:  BP 118/76 mmHg  Ht 6\' 3"  (1.905 m)  Wt 322 lb (146.058 kg)  BMI 40.25 kg/m2 , BMI Body mass index is 40.25 kg/(m^2). GEN: Well nourished, well developed, in no acute distress HEENT: normal Neck: no  JVD, carotid bruits, or masses Cardiac: RRR; no murmurs, rubs, or gallops,no edema  Respiratory:  clear to auscultation bilaterally, normal work of breathing GI: soft, nontender, nondistended, + BS MS: no deformity or atrophy Skin: warm and dry, no rash Neuro:  Strength and sensation are intact Psych: euthymic mood, full affect     Recent Labs: 05/10/2014: ALT 14 12/15/2014: BUN 12; Creatinine 0.82; Hemoglobin 14.4; Platelets 232.0; Potassium 3.8; Sodium 138   Lipid Panel No results found for: CHOL, TRIG, HDL, CHOLHDL, VLDL, LDLCALC, LDLDIRECT   Other studies Reviewed: Additional studies/ records that were reviewed today with results demonstrating: Was in NSR at time of scheduled cardoversion.   ASSESSMENT AND PLAN:  1. Atrial fibrillation: Continue current medicines. Plan for ablation.  Further plans based on ablation result. 2. Obesity: We stressed the importance of weight loss. 3. Sleep apnea: Continue with treatment with CPAP.   Current medicines are reviewed at length with the patient today.  The patient concerns regarding his medicines were addressed.  The following changes have been made:  No change  Labs/ tests ordered today include:  No orders of the defined types were placed in this encounter.    Recommend 150 minutes/week of aerobic exercise Low fat, low carb, high fiber diet recommended  Disposition:   FU in post ablation   Teresita Madura., MD  12/22/2014 9:17 AM    [facility] [place], [place], [place]  [address] Phone: [phone]; Fax: [phone]

## 2014-12-24 NOTE — Discharge Instructions (Signed)
Transesophageal Echocardiogram °Transesophageal echocardiography (TEE) is a picture test of your heart using sound waves. The pictures taken can give very detailed pictures of your heart. This can help your doctor see if there are problems with your heart. TEE can check: °· If your heart has blood clots in it. °· How well your heart valves are working. °· If you have an infection on the inside of your heart. °· Some of the major arteries of your heart. °· If your heart valve is working after a repair. °· Your heart before a procedure that uses a shock to your heart to get the rhythm back to normal. °BEFORE THE PROCEDURE °· Do not eat or drink for 6 hours before the procedure or as told by your doctor. °· Make plans to have someone drive you home after the procedure. Do not drive yourself home. °· An IV tube will be put in your arm. °PROCEDURE °· You will be given a medicine to help you relax (sedative). It will be given through the IV tube. °· A numbing medicine will be sprayed or gargled in the back of your throat to help numb it. °· The tip of the probe is placed into the back of your mouth. You will be asked to swallow. This helps to pass the probe into your esophagus. °· Once the tip of the probe is in the right place, your doctor can take pictures of your heart. °· You may feel pressure at the back of your throat. °AFTER THE PROCEDURE °· You will be taken to a recovery area so the sedative can wear off. °· Your throat may be sore and scratchy. This will go away slowly over time. °· You will go home when you are fully awake and able to swallow liquids. °· You should have someone stay with you for the next 24 hours. °· Do not drive or operate machinery for the next 24 hours. °Document Released: 05/15/2009 Document Revised: 07/23/2013 Document Reviewed: 01/17/2013 °ExitCare® Patient Information ©2015 [employer]. This information is not intended to replace advice given to you by your health care provider. Make  sure you discuss any questions you have with your health care provider. ° °

## 2014-12-24 NOTE — Progress Notes (Signed)
  Echocardiogram Echocardiogram Transesophageal has been performed.  Darlina Sicilian M 12/24/2014, 9:43 AM

## 2014-12-25 ENCOUNTER — Encounter (HOSPITAL_COMMUNITY)
Admission: RE | Disposition: A | Discharge status: Home / Self Care | Payer: Self-pay | Source: Ambulatory Visit | Attending: Internal Medicine

## 2014-12-25 ENCOUNTER — Encounter (HOSPITAL_COMMUNITY): Payer: Self-pay | Admitting: Cardiology

## 2014-12-25 ENCOUNTER — Ambulatory Visit (HOSPITAL_COMMUNITY): Payer: 59 | Admitting: Certified Registered Nurse Anesthetist

## 2014-12-25 ENCOUNTER — Ambulatory Visit (HOSPITAL_COMMUNITY)
Admission: RE | Admit: 2014-12-25 | Discharge: 2014-12-26 | Disposition: A | Discharge status: Home / Self Care | Payer: 59 | Source: Ambulatory Visit | Attending: Internal Medicine | Admitting: Internal Medicine

## 2014-12-25 DIAGNOSIS — G4733 Obstructive sleep apnea (adult) (pediatric): Secondary | ICD-10-CM | POA: Diagnosis not present

## 2014-12-25 DIAGNOSIS — E669 Obesity, unspecified: Secondary | ICD-10-CM | POA: Diagnosis not present

## 2014-12-25 DIAGNOSIS — I480 Paroxysmal atrial fibrillation: Secondary | ICD-10-CM

## 2014-12-25 DIAGNOSIS — I4891 Unspecified atrial fibrillation: Secondary | ICD-10-CM | POA: Diagnosis present

## 2014-12-25 DIAGNOSIS — F419 Anxiety disorder, unspecified: Secondary | ICD-10-CM | POA: Diagnosis not present

## 2014-12-25 HISTORY — PX: ELECTROPHYSIOLOGIC STUDY: SHX172A

## 2014-12-25 LAB — MRSA PCR SCREENING: MRSA by PCR: NEGATIVE

## 2014-12-25 LAB — POCT ACTIVATED CLOTTING TIME
ACTIVATED CLOTTING TIME: 306 s
ACTIVATED CLOTTING TIME: 282 s
ACTIVATED CLOTTING TIME: 171 s
Activated Clotting Time: 276 seconds
Activated Clotting Time: 300 seconds

## 2014-12-25 SURGERY — ATRIAL FIBRILLATION ABLATION
Anesthesia: General

## 2014-12-25 MED ORDER — ADENOSINE 6 MG/2ML IV SOLN
INTRAVENOUS | Status: DC | PRN
  Administered 2014-12-25: 12 mg via INTRAVENOUS

## 2014-12-25 MED ORDER — HYDROXYZINE PAMOATE 25 MG PO CAPS
25.0000 mg | ORAL_CAPSULE | Freq: Four times a day (QID) | ORAL | Status: DC | PRN
  Filled 2014-12-25: qty 1

## 2014-12-25 MED ORDER — FENTANYL CITRATE (PF) 100 MCG/2ML IJ SOLN
INTRAMUSCULAR | Status: DC | PRN
  Administered 2014-12-25 (×2): 50 ug via INTRAVENOUS
  Administered 2014-12-25 (×2): 25 ug via INTRAVENOUS
  Administered 2014-12-25: 50 ug via INTRAVENOUS
  Administered 2014-12-25 (×2): 25 ug via INTRAVENOUS
  Administered 2014-12-25: 100 ug via INTRAVENOUS

## 2014-12-25 MED ORDER — LIDOCAINE HCL (CARDIAC) 20 MG/ML IV SOLN
INTRAVENOUS | Status: DC | PRN
  Administered 2014-12-25: 80 mg via INTRAVENOUS

## 2014-12-25 MED ORDER — RIVAROXABAN 20 MG PO TABS
20.0000 mg | ORAL_TABLET | Freq: Every day | ORAL | Status: DC
  Filled 2014-12-25 (×3): qty 1

## 2014-12-25 MED ORDER — HEPARIN SODIUM (PORCINE) 1000 UNIT/ML IJ SOLN
INTRAMUSCULAR | Status: DC | PRN
  Administered 2014-12-25: 12000 [IU] via INTRAVENOUS

## 2014-12-25 MED ORDER — HYDROCODONE-ACETAMINOPHEN 5-325 MG PO TABS
1.0000 | ORAL_TABLET | ORAL | Status: DC | PRN
  Administered 2014-12-25 (×2): 2 via ORAL
  Administered 2014-12-26 (×2): 1 via ORAL
  Administered 2014-12-26: 2 via ORAL
  Filled 2014-12-25 (×5): qty 2

## 2014-12-25 MED ORDER — ACETAMINOPHEN 325 MG PO TABS: 650.0000 mg | ORAL_TABLET | ORAL | Status: DC | PRN

## 2014-12-25 MED ORDER — PROTAMINE SULFATE 10 MG/ML IV SOLN
INTRAVENOUS | Status: DC | PRN
  Administered 2014-12-25: 30 mg via INTRAVENOUS

## 2014-12-25 MED ORDER — ALBUMIN HUMAN 5 % IV SOLN
INTRAVENOUS | Status: DC | PRN
  Administered 2014-12-25: 09:00:00 via INTRAVENOUS

## 2014-12-25 MED ORDER — HEPARIN SODIUM (PORCINE) 1000 UNIT/ML IJ SOLN
INTRAMUSCULAR | Status: AC
  Filled 2014-12-25: qty 1

## 2014-12-25 MED ORDER — BUPIVACAINE HCL (PF) 0.25 % IJ SOLN
INTRAMUSCULAR | Status: DC | PRN
  Administered 2014-12-25: 20 mg

## 2014-12-25 MED ORDER — METHOCARBAMOL 750 MG PO TABS
750.0000 mg | ORAL_TABLET | Freq: Three times a day (TID) | ORAL | Status: DC | PRN
  Filled 2014-12-25: qty 1

## 2014-12-25 MED ORDER — SODIUM CHLORIDE 0.9 % IV SOLN
2.0000 ug/min | INTRAVENOUS | Status: DC
  Filled 2014-12-25 (×3): qty 2

## 2014-12-25 MED ORDER — LACTATED RINGERS IV SOLN
INTRAVENOUS | Status: DC | PRN
  Administered 2014-12-25 (×2): via INTRAVENOUS

## 2014-12-25 MED ORDER — ONDANSETRON HCL 4 MG/2ML IJ SOLN: 4.0000 mg | Freq: Once | INTRAMUSCULAR | Status: AC | PRN

## 2014-12-25 MED ORDER — FENTANYL CITRATE (PF) 100 MCG/2ML IJ SOLN
25.0000 ug | INTRAMUSCULAR | Status: DC | PRN
Start: 2014-12-25 — End: 2014-12-26

## 2014-12-25 MED ORDER — HEPARIN SODIUM (PORCINE) 1000 UNIT/ML IJ SOLN
INTRAMUSCULAR | Status: DC | PRN
  Administered 2014-12-25: 1000 [IU] via INTRAVENOUS
  Administered 2014-12-25: 4000 [IU] via INTRAVENOUS
  Administered 2014-12-25: 2000 [IU] via INTRAVENOUS

## 2014-12-25 MED ORDER — PROPOFOL 10 MG/ML IV BOLUS
INTRAVENOUS | Status: DC | PRN
  Administered 2014-12-25: 200 mg via INTRAVENOUS

## 2014-12-25 MED ORDER — ROPINIROLE HCL 1 MG PO TABS
1.0000 mg | ORAL_TABLET | Freq: Three times a day (TID) | ORAL | Status: DC
  Administered 2014-12-25 – 2014-12-26 (×3): 1 mg via ORAL
  Filled 2014-12-25 (×5): qty 1

## 2014-12-25 MED ORDER — MIDAZOLAM HCL 5 MG/5ML IJ SOLN
INTRAMUSCULAR | Status: DC | PRN
  Administered 2014-12-25: 2 mg via INTRAVENOUS

## 2014-12-25 MED ORDER — ONDANSETRON HCL 4 MG/2ML IJ SOLN
INTRAMUSCULAR | Status: DC | PRN
  Administered 2014-12-25: 4 mg via INTRAVENOUS

## 2014-12-25 MED ORDER — DEXTROSE 5 % IV SOLN
1000.0000 ug | INTRAVENOUS | Status: DC | PRN
  Administered 2014-12-25: 20 ug/min via INTRAVENOUS

## 2014-12-25 MED ORDER — SODIUM CHLORIDE 0.9 % IV SOLN
INTRAVENOUS | Status: DC
  Administered 2014-12-25: 1000 mL via INTRAVENOUS

## 2014-12-25 MED ORDER — SODIUM CHLORIDE 0.9 % IJ SOLN: 3.0000 mL | Freq: Two times a day (BID) | INTRAMUSCULAR | Status: DC

## 2014-12-25 MED ORDER — ADENOSINE 6 MG/2ML IV SOLN
INTRAVENOUS | Status: AC
  Filled 2014-12-25: qty 2

## 2014-12-25 MED ORDER — BUPIVACAINE HCL (PF) 0.25 % IJ SOLN
INTRAMUSCULAR | Status: AC
  Filled 2014-12-25: qty 30

## 2014-12-25 MED ORDER — SODIUM CHLORIDE 0.9 % IV SOLN: 250.0000 mL | INTRAVENOUS | Status: DC | PRN

## 2014-12-25 MED ORDER — ONDANSETRON HCL 4 MG/2ML IJ SOLN: 4.0000 mg | Freq: Four times a day (QID) | INTRAMUSCULAR | Status: DC | PRN

## 2014-12-25 MED ORDER — SODIUM CHLORIDE 0.9 % IJ SOLN: 3.0000 mL | INTRAMUSCULAR | Status: DC | PRN

## 2014-12-25 MED ORDER — GABAPENTIN 300 MG PO CAPS
300.0000 mg | ORAL_CAPSULE | Freq: Three times a day (TID) | ORAL | Status: DC
  Administered 2014-12-25 – 2014-12-26 (×3): 300 mg via ORAL
  Filled 2014-12-25 (×5): qty 1

## 2014-12-25 SURGICAL SUPPLY — 24 items
BAG SNAP BAND KOVER 36X36 (MISCELLANEOUS) ×2 IMPLANT
BLANKET WARM UNDERBOD FULL ACC (MISCELLANEOUS) ×2 IMPLANT
CATH DIAG 6FR PIGTAIL (CATHETERS) ×2 IMPLANT
CATH NAVISTAR SMARTTOUCH DF (ABLATOR) ×2 IMPLANT
CATH SOUNDSTAR 3D IMAGING (CATHETERS) ×2 IMPLANT
CATH VARIABLE LASSO NAV 2515 (CATHETERS) ×1 IMPLANT
CATH WEBSTER BI DIR CS D-F CRV (CATHETERS) ×2 IMPLANT
COVER SWIFTLINK CONNECTOR (BAG) ×2 IMPLANT
NDL TRANSEP BRK 71CM 407200 (NEEDLE) ×1 IMPLANT
NEEDLE TRANSEP BRK 71CM 407200 (NEEDLE) ×2 IMPLANT
PACK EP LATEX FREE (CUSTOM PROCEDURE TRAY) ×2
PACK EP LF (CUSTOM PROCEDURE TRAY) ×1 IMPLANT
PAD DEFIB LIFELINK (PAD) ×2 IMPLANT
PATCH CARTO3 (PAD) ×2 IMPLANT
SHEATH AVANTI 11F 11CM (SHEATH) ×2 IMPLANT
SHEATH PINNACLE 6F 10CM (SHEATH) ×2 IMPLANT
SHEATH PINNACLE 7F 10CM (SHEATH) ×2 IMPLANT
SHEATH PINNACLE 8F 10CM (SHEATH) ×2 IMPLANT
SHEATH PINNACLE 9F 10CM (SHEATH) ×2 IMPLANT
SHEATH SWARTZ TS SL2 63CM 8.5F (SHEATH) ×2 IMPLANT
SHIELD RADPAD SCOOP 12X17 (MISCELLANEOUS) ×2 IMPLANT
SYR MEDRAD MARK V 150ML (SYRINGE) ×2 IMPLANT
TUBING CONTRAST HIGH PRESS 48 (TUBING) ×2 IMPLANT
TUBING SMART ABLATE COOLFLOW (TUBING) ×2 IMPLANT

## 2014-12-25 NOTE — Discharge Instructions (Signed)
No driving for 4 days. No lifting over 5 lbs for 1 week. No sexual activity for 1 week. You may return to work in 1 week. Keep procedure site clean & dry. If you notice increased pain, swelling, bleeding or pus, call/return!  You may shower, but no soaking baths/hot tubs/pools for 1 week.  ° ° °

## 2014-12-25 NOTE — Progress Notes (Signed)
Pt complain of dull chest discomfort at 7 out of 10.  Dr Rayann Heman notified and will come to assess the pt.

## 2014-12-25 NOTE — Transfer of Care (Signed)
Immediate Anesthesia Transfer of Care Note  Patient: Justin Kidd  Procedure(s) Performed: Procedure(s): Atrial Fibrillation Ablation (N/A)  Patient Location: PACU and Cath Lab  Anesthesia Type:General  Level of Consciousness: awake, alert  and oriented  Airway & Oxygen Therapy: Patient Spontanous Breathing and Patient connected to nasal cannula oxygen  Post-op Assessment: Report given to RN and Post -op Vital signs reviewed and stable  Post vital signs: Reviewed and stable  Last Vitals:  Filed Vitals:   12/25/14 0541  BP: 132/86  Pulse: 88  Temp: 36.6 C  Resp: 18    Complications: No apparent anesthesia complications

## 2014-12-25 NOTE — Anesthesia Procedure Notes (Signed)
Procedure Name: LMA Insertion Date/Time: 12/25/2014 7:51 AM Performed by: [place] Pink Pre-anesthesia Checklist: Patient identified, Emergency Drugs available, Suction available, Patient being monitored and Timeout performed Patient Re-evaluated:Patient Re-evaluated prior to inductionOxygen Delivery Method: Circle system utilized Preoxygenation: Pre-oxygenation with 100% oxygen Intubation Type: IV induction LMA: LMA inserted LMA Size: 5.0 Number of attempts: 1 Placement Confirmation: positive ETCO2 and breath sounds checked- equal and bilateral Tube secured with: Tape Dental Injury: Teeth and Oropharynx as per pre-operative assessment

## 2014-12-25 NOTE — Anesthesia Preprocedure Evaluation (Signed)
Anesthesia Evaluation  Patient identified by MRN, date of birth, ID band Patient awake    Reviewed: Allergy & Precautions, NPO status , Patient's Chart, lab work & pertinent test results  History of Anesthesia Complications Negative for: history of anesthetic complications  Airway Mallampati: II  TM Distance: >3 FB Neck ROM: Full    Dental no notable dental hx. (+) Dental Advisory Given   Pulmonary neg pulmonary ROS, sleep apnea and Continuous Positive Airway Pressure Ventilation ,  breath sounds clear to auscultation  Pulmonary exam normal       Cardiovascular Normal cardiovascular exam+ dysrhythmias Atrial Fibrillation Rhythm:Regular Rate:Normal     Neuro/Psych PSYCHIATRIC DISORDERS Anxiety negative neurological ROS     GI/Hepatic negative GI ROS, Neg liver ROS,   Endo/Other  Morbid obesity  Renal/GU negative Renal ROS  negative genitourinary   Musculoskeletal  (+) Arthritis -, Osteoarthritis,    Abdominal   Peds negative pediatric ROS (+)  Hematology negative hematology ROS (+)   Anesthesia Other Findings   Reproductive/Obstetrics negative OB ROS                             Anesthesia Physical Anesthesia Plan  ASA: III  Anesthesia Plan: General   Post-op Pain Management:    Induction: Intravenous  Airway Management Planned: LMA  Additional Equipment:   Intra-op Plan:   Post-operative Plan: Extubation in OR  Informed Consent: I have reviewed the patients History and Physical, chart, labs and discussed the procedure including the risks, benefits and alternatives for the proposed anesthesia with the patient or authorized representative who has indicated his/her understanding and acceptance.   Dental advisory given  Plan Discussed with: CRNA  Anesthesia Plan Comments:         Anesthesia Quick Evaluation

## 2014-12-25 NOTE — H&P (View-Only) (Signed)
Electrophysiology Office Note   Date:  12/15/2014   ID:  Justin Kidd, DOB 1960/12/06, MRN [id number]  PCP:  REDMON,NOELLE, PA-C  Cardiologist:  Dr Irish Lack Primary Electrophysiologist: Thompson Grayer, MD    Chief Complaint  Patient presents with  . Atrial Fibrillation     History of Present Illness: Justin Kidd is a 54 y.o. male who presents today for electrophysiology evaluation.   He reports initially being diagnosed with atrial fibrillation 21 years ago after presenting with symptoms of palpitations and weakness to an urgent clinic.  Episodes of atrial fibrillation have increased in frequency and duration since that time.  He was treated initially with diltiazem and coumadin.  He was placed on flecainide several months ago due to worsening afib.  He also thinks that he was having "breathing problems" with flecainide.   He has since failed medical therapy with flecainide and has been placed on propafenone by [name].  He was referred for cardioversion but he had converted to sinus rhythm by the time that he arrived at [facility].  Today, he is back in afib.  He has previously found that caffeine is a trigger for afib.  He has been evaluated and diagnosed with OSA.  He is currently working on CPAP titration.  During afib, he reports fatigue, palpitations, and dizziness when he stands.  His exercise tolerance is reduced and he feels "like I have run a marathon".  He is now on xarelto and is tolerating this well.  Today, he denies symptoms of palpitations, chest pain,  orthopnea, PND, lower extremity edema, claudication, presyncope, syncope, bleeding, or neurologic sequela. The patient is tolerating medications without difficulties and is otherwise without complaint today.    Past Medical History  Diagnosis Date  . Atrial fibrillation   . Pneumonia   . Anxiety   . Kidney stone   . Obstructive sleep apnea     still titrating CPAP  . Overweight   . DDD (degenerative disc disease),  cervical    Past Surgical History  Procedure Laterality Date  . Shoulder surgery    . Ankle surgery    . Wrist surgery    . Shoulder arthroscopy with open rotator cuff repair Bilateral   . Ankle surgery Right   . Wrist surgery Right      Current Outpatient Prescriptions  Medication Sig Dispense Refill  . diltiazem (CARDIZEM) 60 MG tablet Take 1 tablet as directed as needed (Patient taking differently: Take 60 mg by mouth as needed (AFIB). Take 1 tablet as directed as needed) 120 tablet 0  . furosemide (LASIX) 20 MG tablet Take 20 mg by mouth daily as needed for fluid.    Marland Kitchen gabapentin (NEURONTIN) 300 MG capsule Take 1 capsule by mouth 3 (three) times daily.    . hydrOXYzine (VISTARIL) 25 MG capsule Take 25 mg by mouth every 6 (six) hours as needed for anxiety. For anxiety  0  . methocarbamol (ROBAXIN) 750 MG tablet Take 750 mg by mouth every 8 (eight) hours as needed for muscle spasms.   2  . oxyCODONE-acetaminophen (PERCOCET/ROXICET) 5-325 MG per tablet Take 1 tablet by mouth every 6 (six) hours as needed for moderate pain.   0  . Potassium Chloride ER 20 MEQ TBCR 1 TABLET DAILY BY MOUTH  ON THE DAYS YOU TAKE FUROSEMIDE(LASIX) 30 tablet 6  . propafenone (RYTHMOL) 225 MG tablet Take 1 tablet (225 mg total) by mouth 2 (two) times daily. Start taking on 11/21/14 60 tablet 2  .  rOPINIRole (REQUIP) 1 MG tablet Take 1 mg by mouth 3 (three) times daily.  1  . XARELTO 20 MG TABS tablet TAKE 1 TABLET BY MOUTH DAILY WITH SUPPER 30 tablet 1   No current facility-administered medications for this visit.    Allergies:   Review of patient's allergies indicates no known allergies.   Social History:  The patient  reports that he has never smoked. He has never used smokeless tobacco. He reports that he does not drink alcohol or use illicit drugs.   Family History:  The patient's  family history includes Cancer in his paternal grandmother; Heart attack (age of onset: 63) in his mother; Heart failure in  his mother; Stroke in his other.    ROS:  Please see the history of present illness.   All other systems are reviewed and negative.    PHYSICAL EXAM: VS:  BP 130/68 mmHg  Pulse 84  Ht 6\' 3"  (1.905 m)  Wt 326 lb 9.6 oz (148.145 kg)  BMI 40.82 kg/m2 , BMI Body mass index is 40.82 kg/(m^2). GEN: overweight, in no acute distress HEENT: normal Neck: no JVD, carotid bruits, or masses Cardiac: iRRR; no murmurs, rubs, or gallops,no edema  Respiratory:  clear to auscultation bilaterally, normal work of breathing GI: soft, nontender, nondistended, + BS MS: no deformity or atrophy Skin: warm and dry  Neuro:  Strength and sensation are intact Psych: euthymic mood, full affect  EKG:  EKG is ordered today. The ekg ordered today shows afib, V rate 84 bpm,    Recent Labs: 05/10/2014: ALT 14 09/08/2014: Hemoglobin 14.7; Platelets 249.0 11/20/2014: BUN 11; Creatinine 0.99; Potassium 4.3; Sodium 137    Lipid Panel  No results found for: CHOL, TRIG, HDL, CHOLHDL, VLDL, LDLCALC, LDLDIRECT   Wt Readings from Last 3 Encounters:  12/15/14 326 lb 9.6 oz (148.145 kg)  11/20/14 318 lb 9.6 oz (144.516 kg)  11/17/14 305 lb (138.347 kg)      Other studies Reviewed: Additional studies/ records that were reviewed today include: [place] [facility] notes, echo  Review of the above records today demonstrates: preserved EF, no significant valvular disease, LA 18mm   ASSESSMENT AND PLAN:  1.  Paroxysmal atrial fibrillation The patient has symptomatic recurrent afib.  He has failed medical therapy with flecainde and propafenone.   Therapeutic strategies for afib including medicine and ablation were discussed in detail with the patient today. Risk, benefits, and alternatives to EP study and radiofrequency ablation for afib were also discussed in detail today. These risks include but are not limited to stroke, bleeding, vascular damage, tamponade, perforation, damage to the esophagus, lungs, and other  structures, pulmonary vein stenosis, worsening renal function, and death. The patient understands these risk and wishes to proceed.  We will therefore proceed with catheter ablation at the next available time.  He will require TEE prior to the procedure.  chads2vasc score is 0.  2. obesity Body mass index is 40.82 kg/(m^2). I have reviewed the patients BMI and decreased success rates with ablation at length today.  Weight loss is strongly advised.  Per Guijian et al (PACE 2013; 36: 756-433), patients with BMI 25-29.9 (obese) have a 27% increase in AF recurrence post ablation.  Patients with BMI >30 have a 31% increase in AF recurrence post ablation when compared to those with BMI <25.  3. OSA Compliance with CPAP is encouraged  Current medicines are reviewed at length with the patient today.   The patient does not have concerns regarding  his medicines.  The following changes were made today:  none  Signed, Thompson Grayer, MD  12/15/2014 9:00 AM     [facility] [address] [place] [address] [phone] (office) [phone] (fax)

## 2014-12-25 NOTE — Anesthesia Postprocedure Evaluation (Signed)
Anesthesia Post Note  Patient: Justin Kidd  Procedure(s) Performed: Procedure(s) (LRB): Atrial Fibrillation Ablation (N/A)  Anesthesia type: General  Patient location: PACU  Post pain: Pain level controlled and Adequate analgesia  Post assessment: Post-op Vital signs reviewed, Patient's Cardiovascular Status Stable, Respiratory Function Stable, Patent Airway and Pain level controlled  Last Vitals:  Filed Vitals:   12/25/14 1210  BP: 129/82  Pulse: 85  Temp:   Resp: 9    Post vital signs: Reviewed and stable  Level of consciousness: awake, alert  and oriented  Complications: No apparent anesthesia complications

## 2014-12-25 NOTE — Progress Notes (Addendum)
Site area: right groin a 7, 9 11 french venous sheath was removed   Site Prior to Removal:  Level 0  Pressure Applied For 15 MINUTES    Minutes Beginning at 1200p  Manual:   Yes.    Patient Status During Pull:  stable  Post Pull Groin Site:  Level 0  Post Pull Instructions Given:  Yes.    Post Pull Pulses Present:  Yes.    Dressing Applied:  Yes.    Comments:  VS remain stable during sheath pull. Pt medicated with Fentanyl 50 mcg IV by Elmo Putt CRNA for neck pain.                       Pt denies any discomfort at site at this time

## 2014-12-25 NOTE — Discharge Summary (Signed)
ELECTROPHYSIOLOGY PROCEDURE DISCHARGE SUMMARY    Patient ID: Justin Kidd,  MRN: [id number], DOB/AGE: 1960/11/19 54 y.o.  Admit date: 12/25/2014 Discharge date: 12/26/2014  Primary Care Physician: Cleda Mccreedy Primary Cardiologist: Irish Lack Electrophysiologist: Thompson Grayer, MD  Primary Discharge Diagnosis:  Paroxysmal atrial fibrillation status post ablation this admission  Secondary Discharge Diagnosis:  1.  Obesity 2.  Sleep apnea - on CPAP 3.  DDD  Procedures This Admission:  1.  Electrophysiology study and radiofrequency catheter ablation on 12-25-14 by Dr Thompson Grayer.  This study demonstrated sinus rhythm upon presentation; rotational Angiography reveals a moderate sized left atrium with four separate pulmonary veins without evidence of pulmonary vein stenosis. There are a huge common ostium to the left superior and inferior PVs; successful electrical isolation and anatomical encircling of all four pulmonary veins with radiofrequency current using a WACA approach. Additional ablation was performed at the rim of the left atrial appendage and along the ridge of the appendage. Additional ablation was also performed along the carina between the RSPV and RIPV; no inducible arrhythmias following ablation both on and off of Isuprel. No reconnection with adenosine; no early apparent complications.  Brief HPI: Justin Kidd is a 54 y.o. male  with a history of paroxysmal atrial fibrillation.  They have failed medical therapy with Rhythmol. Risks, benefits, and alternatives to catheter ablation of atrial fibrillation were reviewed with the patient who wished to proceed.  The patient underwent TEE prior to the procedure which demonstrated normal LV function and no LAA thrombus.    Hospital Course:  The patient was admitted and underwent EPS/RFCA of atrial fibrillation with details as outlined above.  They were monitored on telemetry overnight which demonstrated sinus rhythm.   Groin was without complication on the day of discharge.  The patient was examined and considered to be stable for discharge.  Wound care and restrictions were reviewed with the patient.  The patient will be seen back by Roderic Palau, NP in 4 weeks and Dr Rayann Heman in 12 weeks for post ablation follow up.   This patients CHA2DS2-VASc Score and unadjusted Ischemic Stroke Rate (% per year) is equal to 0.2 % stroke rate/year from a score of 0 Above score calculated as 1 point each if present [CHF, HTN, DM, Vascular=MI/PAD/Aortic Plaque, Age if 65-74, or Male] Above score calculated as 2 points each if present [Age > 75, or Stroke/TIA/TE]   Physical Exam: Filed Vitals:   12/25/14 1800 12/25/14 1947 12/26/14 0012 12/26/14 0353  BP: 104/68 113/81 111/59 113/67  Pulse: 91 90 77 74  Temp:  98.3 F (36.8 C) 97.9 F (36.6 C) 97.5 F (36.4 C)  TempSrc:  Oral Oral Oral  Resp: 15 13 11 18   Height:      Weight:      SpO2: 98% 90% 95% 99%    GEN- The patient is well appearing, alert and oriented x 3 today.   HEENT: normocephalic, atraumatic; sclera clear, conjunctiva pink; hearing intact; oropharynx clear; neck supple, no JVP Lymph- no cervical lymphadenopathy Lungs- Clear to ausculation bilaterally, normal work of breathing.  No wheezes, rales, rhonchi Heart- Regular rate and rhythm, no murmurs, rubs or gallops, PMI not laterally displaced GI- soft, non-tender, non-distended, bowel sounds present, no hepatosplenomegaly Extremities- no clubbing, cyanosis, or edema; DP/PT/radial pulses 2+ bilaterally, groin without hematoma/bruit MS- no significant deformity or atrophy Skin- warm and dry, no rash or lesion Psych- euthymic mood, full affect Neuro- strength and sensation are intact   Labs:  Lab Results  Component Value Date   WBC 8.1 12/15/2014   HGB 14.4 12/15/2014   HCT 42.5 12/15/2014   MCV 87.9 12/15/2014   PLT 232.0 12/15/2014     Recent Labs Lab 12/26/14 0342  NA 133*  K 3.4*    CL 101  CO2 26  BUN 8  CREATININE 0.77  CALCIUM 8.4*  GLUCOSE 120*     Discharge Medications:    Medication List    STOP taking these medications        propafenone 225 MG tablet  Commonly known as:  RYTHMOL      TAKE these medications        diltiazem 60 MG tablet  Commonly known as:  CARDIZEM  Take 1 tablet as directed as needed     furosemide 20 MG tablet  Commonly known as:  LASIX  Take 20 mg by mouth daily as needed for fluid.     gabapentin 300 MG capsule  Commonly known as:  NEURONTIN  Take 300 mg by mouth 3 (three) times daily.     hydrOXYzine 25 MG capsule  Commonly known as:  VISTARIL  Take 25 mg by mouth every 6 (six) hours as needed for anxiety. For anxiety     methocarbamol 750 MG tablet  Commonly known as:  ROBAXIN  Take 750 mg by mouth every 8 (eight) hours as needed for muscle spasms.     pantoprazole 40 MG tablet  Commonly known as:  PROTONIX  Take 1 tablet (40 mg total) by mouth daily.     Potassium Chloride ER 20 MEQ Tbcr  1 TABLET DAILY BY MOUTH  ON THE DAYS YOU TAKE FUROSEMIDE(LASIX)     rOPINIRole 1 MG tablet  Commonly known as:  REQUIP  Take 1 mg by mouth 3 (three) times daily.     XARELTO 20 MG Tabs tablet  Generic drug:  rivaroxaban  TAKE 1 TABLET BY MOUTH DAILY WITH SUPPER        Disposition:  Discharge Instructions    Diet - low sodium heart healthy    Complete by:  As directed      Increase activity slowly    Complete by:  As directed           Follow-up Information    Follow up with [facility] On 01/22/2015.   Why:  at 8:30AM   Contact information:   [place] [phone]      Follow up with Thompson Grayer, MD On 04/01/2015.   Specialty:  Cardiology   Why:  at 9:15AM   Contact information:   [place] [phone]       Duration of Discharge Encounter: Greater than 30 minutes including physician  time.  Signed, Chanetta Marshall, NP 12/26/2014 7:12 AM    I have seen, examined the patient, and reviewed the above assessment and plan.  Changes to above are made where necessary.    Co Sign: Thompson Grayer, MD

## 2014-12-25 NOTE — Progress Notes (Signed)
Pt care given by Harrison Mons RN [facility] for Justin Kidd for lunch relief.  Family at bedside

## 2014-12-25 NOTE — Interval H&P Note (Signed)
History and Physical Interval Note:  12/25/2014 7:21 AM  [name]  has presented today for surgery, with the diagnosis of afib  The various methods of treatment have been discussed with the patient and family. After consideration of risks, benefits and other options for treatment, the patient has consented to  Procedure(s): Atrial Fibrillation Ablation (N/A) as a surgical intervention .  The patient's history has been reviewed, patient examined, no change in status, stable for surgery.  I have reviewed the patient's chart and labs.  Questions were answered to the patient's satisfaction.     Justin Kidd

## 2014-12-25 NOTE — Progress Notes (Signed)
Dr Rayann Heman in to see pt and spoke to him about results of the procedure and plan of care.  Ultrasound and EKG completed.  No new orders given.

## 2014-12-26 ENCOUNTER — Other Ambulatory Visit: Payer: Self-pay | Admitting: Interventional Cardiology

## 2014-12-26 DIAGNOSIS — G4733 Obstructive sleep apnea (adult) (pediatric): Secondary | ICD-10-CM | POA: Diagnosis not present

## 2014-12-26 DIAGNOSIS — F419 Anxiety disorder, unspecified: Secondary | ICD-10-CM | POA: Diagnosis not present

## 2014-12-26 DIAGNOSIS — I480 Paroxysmal atrial fibrillation: Secondary | ICD-10-CM

## 2014-12-26 DIAGNOSIS — E669 Obesity, unspecified: Secondary | ICD-10-CM | POA: Diagnosis not present

## 2014-12-26 LAB — BASIC METABOLIC PANEL
Anion gap: 6 (ref 5–15)
BUN: 8 mg/dL (ref 6–20)
CO2: 26 mmol/L (ref 22–32)
Calcium: 8.4 mg/dL — ABNORMAL LOW (ref 8.9–10.3)
Chloride: 101 mmol/L (ref 101–111)
Creatinine, Ser: 0.77 mg/dL (ref 0.61–1.24)
GLUCOSE: 120 mg/dL — AB (ref 65–99)
Potassium: 3.4 mmol/L — ABNORMAL LOW (ref 3.5–5.1)
Sodium: 133 mmol/L — ABNORMAL LOW (ref 135–145)

## 2014-12-26 LAB — MRSA PCR SCREENING: MRSA by PCR: NEGATIVE

## 2014-12-26 MED ORDER — PANTOPRAZOLE SODIUM 40 MG PO TBEC: 40.0000 mg | DELAYED_RELEASE_TABLET | Freq: Every day | ORAL | Status: DC

## 2014-12-26 MED ORDER — POTASSIUM CHLORIDE CRYS ER 20 MEQ PO TBCR
20.0000 meq | EXTENDED_RELEASE_TABLET | Freq: Once | ORAL | Status: AC
  Administered 2014-12-26: 20 meq via ORAL
  Filled 2014-12-26: qty 1

## 2014-12-26 NOTE — Progress Notes (Signed)
DC orders received.  Patient stable with no S/S of distress.  Medication and discharge instructions reviewed with patient as well as activity/driving restrictions.  Patient DC home. [place], Ardeth Sportsman

## 2014-12-26 NOTE — Care Management Note (Signed)
Case Management Note  Patient Details  Name: AFTON MIKELSON MRN: [id number] Date of Birth: [birth year]  Subjective/Objective:      Adm w at ib              Action/Plan: lives w fam, pcp dr n redmon   Expected Discharge Date:   12/26/14               Expected Discharge Plan:  Home/Self Care  In-House Referral:     Discharge planning Services  CM Consult, Medication Assistance  Post Acute Care Choice:    Choice offered to:     DME Arranged:    DME Agency:     HH Arranged:    [place] Agency:     Status of Service:     Medicare Important Message Given:    Date Medicare IM Given:    Medicare IM give by:    Date Additional Medicare IM Given:    Additional Medicare Important Message give by:     If discussed at [place] of Stay Meetings, dates discussed:    Additional Comments:gave pt copay card for xarelto that brings copay down to zero.  Lacretia Leigh, RN 12/26/2014, 9:30 AM

## 2015-01-06 ENCOUNTER — Encounter: Payer: Self-pay | Admitting: Cardiology

## 2015-01-08 ENCOUNTER — Encounter: Payer: Self-pay | Admitting: Physical Medicine & Rehabilitation

## 2015-01-19 ENCOUNTER — Encounter (HOSPITAL_COMMUNITY): Payer: Self-pay | Admitting: Internal Medicine

## 2015-01-22 ENCOUNTER — Encounter (HOSPITAL_COMMUNITY): Payer: Self-pay | Admitting: Nurse Practitioner

## 2015-01-22 ENCOUNTER — Ambulatory Visit (HOSPITAL_COMMUNITY)
Admission: RE | Admit: 2015-01-22 | Discharge: 2015-01-22 | Disposition: A | Discharge status: Home / Self Care | Payer: 59 | Source: Ambulatory Visit | Attending: Nurse Practitioner | Admitting: Nurse Practitioner

## 2015-01-22 VITALS — BP 134/88 | HR 100 | Ht 75.0 in | Wt 319.4 lb

## 2015-01-22 DIAGNOSIS — I480 Paroxysmal atrial fibrillation: Secondary | ICD-10-CM

## 2015-01-22 DIAGNOSIS — I4891 Unspecified atrial fibrillation: Secondary | ICD-10-CM | POA: Diagnosis present

## 2015-01-22 NOTE — Progress Notes (Signed)
Patient ID: Justin Kidd, male   DOB: 03-Dec-1960, 54 y.o.   MRN: [id number]     Primary Care Physician: Cleda Mccreedy Referring Physician: Dr. Nils Pyle is a 54 y.o. male with a h/o PAF s/p PVI 5/26 with Dr. Rayann Heman. He has done extremely well post procedure. Denies any swallowing issues, no rt groin issues. Has not noticed any irregular heart beat post procedure and is in SR today. He is taking Xarelto without fail.  Today, he denies symptoms of palpitations, chest pain, shortness of breath, orthopnea, PND, lower extremity edema, dizziness, presyncope, syncope, or neurologic sequela. The patient is tolerating medications without difficulties and is otherwise without complaint today.   Past Medical History  Diagnosis Date  . Atrial fibrillation   . Pneumonia   . Anxiety   . Kidney stone   . Obstructive sleep apnea     still titrating CPAP  . Overweight   . DDD (degenerative disc disease), cervical    Past Surgical History  Procedure Laterality Date  . Shoulder surgery    . Shoulder arthroscopy with open rotator cuff repair Bilateral   . Ankle surgery Right   . Wrist surgery Right   . Tee without cardioversion N/A 12/24/2014    Procedure: TRANSESOPHAGEAL ECHOCARDIOGRAM (TEE);  Surgeon: [name], MD;  Location: [facility];  Service: Cardiovascular;  Laterality: N/A;  . Electrophysiologic study N/A 12/25/2014    Procedure: Atrial Fibrillation Ablation;  Surgeon: Thompson Grayer, MD;  Location: [facility];  Service: Cardiovascular;  Laterality: N/A;    Current Outpatient Prescriptions  Medication Sig Dispense Refill  . diltiazem (CARDIZEM) 60 MG tablet Take 1 tablet as directed as needed (Patient taking differently: Take 60 mg by mouth as needed (AFIB). Take 1 tablet as directed as needed) 120 tablet 0  . furosemide (LASIX) 20 MG tablet Take 20 mg by mouth daily as needed for fluid.    Marland Kitchen gabapentin (NEURONTIN) 300 MG capsule Take 300 mg by mouth 3  (three) times daily.     . hydrOXYzine (VISTARIL) 25 MG capsule Take 25 mg by mouth every 6 (six) hours as needed for anxiety. For anxiety  0  . methocarbamol (ROBAXIN) 750 MG tablet Take 750 mg by mouth every 8 (eight) hours as needed for muscle spasms.   2  . pantoprazole (PROTONIX) 40 MG tablet Take 1 tablet (40 mg total) by mouth daily. 45 tablet 1  . Potassium Chloride ER 20 MEQ TBCR 1 TABLET DAILY BY MOUTH  ON THE DAYS YOU TAKE FUROSEMIDE(LASIX) 30 tablet 6  . rOPINIRole (REQUIP) 1 MG tablet Take 1 mg by mouth 3 (three) times daily.  1  . XARELTO 20 MG TABS tablet TAKE 1 TABLET BY MOUTH DAILY WITH SUPPER 30 tablet 3   No current facility-administered medications for this encounter.    No Known Allergies  History   Social History  . Marital Status: Divorced    Spouse Name: N/A  . Number of Children: N/A  . Years of Education: N/A   Occupational History  . Not on file.   Social History Main Topics  . Smoking status: Never Smoker   . Smokeless tobacco: Never Used  . Alcohol Use: No  . Drug Use: No  . Sexual Activity: Not on file   Other Topics Concern  . Not on file   Social History Narrative   Pt lives in [place] with mother.  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 Heart failure Mother   . Heart attack Mother 66    Widow maker MI  . Stroke Other   . Cancer Paternal Grandmother     ROS- All systems are reviewed and negative except as per the HPI above  Physical Exam: Filed Vitals:   01/22/15 0843 01/22/15 0855  BP: 152/100 134/88  Pulse: 100   Height: 6\' 3"  (1.905 m)   Weight: 319 lb 6.4 oz (144.879 kg)     GEN- The patient is well appearing, alert and oriented x 3 today.   Head- normocephalic, atraumatic Eyes-  Sclera clear, conjunctiva pink Ears- hearing intact Oropharynx- clear Neck- supple, no JVP Lymph- no cervical lymphadenopathy Lungs- Clear to ausculation  bilaterally, normal work of breathing Heart- Regular rate and rhythm, no murmurs, rubs or gallops, PMI not laterally displaced GI- soft, NT, ND, + BS Extremities- no clubbing, cyanosis, or edema MS- no significant deformity or atrophy Skin- no rash or lesion Psych- euthymic mood, full affect Neuro- strength and sensation are intact  EKG- NSR, normal EKG. Epic records reviewed.   Assessment and Plan:  1. PAF s/p ablation Maintaining SR Continue xarelto  2.Obesity Exercise/wt loss encouraged  3. HTN Repeat BP improved Avoid salt  F/u as scheduled with Dr. Radford Pax and Dr. Rayann Heman, here as needed

## 2015-02-06 ENCOUNTER — Other Ambulatory Visit: Payer: Self-pay | Admitting: *Deleted

## 2015-02-06 ENCOUNTER — Other Ambulatory Visit: Payer: Self-pay | Admitting: Interventional Cardiology

## 2015-02-06 MED ORDER — RIVAROXABAN 20 MG PO TABS: ORAL_TABLET | ORAL | Status: DC

## 2015-02-06 MED ORDER — FUROSEMIDE 20 MG PO TABS: 20.0000 mg | ORAL_TABLET | Freq: Every day | ORAL | Status: DC | PRN

## 2015-02-09 ENCOUNTER — Encounter: Payer: Self-pay | Admitting: Cardiology

## 2015-02-09 NOTE — Progress Notes (Signed)
This encounter was created in error - please disregard.

## 2015-02-10 ENCOUNTER — Encounter: Payer: Self-pay | Admitting: Cardiology

## 2015-02-18 ENCOUNTER — Encounter: Payer: Self-pay | Admitting: Cardiology

## 2015-02-19 ENCOUNTER — Encounter: Payer: Self-pay | Admitting: Physical Medicine & Rehabilitation

## 2015-02-23 ENCOUNTER — Telehealth: Payer: Self-pay | Admitting: *Deleted

## 2015-02-23 DIAGNOSIS — Z9989 Dependence on other enabling machines and devices: Principal | ICD-10-CM

## 2015-02-23 DIAGNOSIS — G4733 Obstructive sleep apnea (adult) (pediatric): Secondary | ICD-10-CM

## 2015-02-23 NOTE — Telephone Encounter (Signed)
-----   Message from Sueanne Margarita, MD sent at 02/23/2015 12:58 PM EDT ----- His AHI is good on CPAP.  Please refer to ENT for evaluation since he has had a injury in the past and cannot breath through his nose

## 2015-02-23 NOTE — Telephone Encounter (Signed)
ONO on CPAP ordered, [facility] notified. I have left patient a message to let him know what we need to do.

## 2015-02-24 ENCOUNTER — Other Ambulatory Visit: Payer: Self-pay | Admitting: Physical Medicine & Rehabilitation

## 2015-02-24 ENCOUNTER — Ambulatory Visit (HOSPITAL_BASED_OUTPATIENT_CLINIC_OR_DEPARTMENT_OTHER): Payer: 59 | Admitting: Physical Medicine & Rehabilitation

## 2015-02-24 ENCOUNTER — Encounter: Payer: Self-pay | Admitting: Physical Medicine & Rehabilitation

## 2015-02-24 ENCOUNTER — Encounter: Payer: 59 | Attending: Physical Medicine & Rehabilitation

## 2015-02-24 VITALS — BP 112/77 | HR 100 | Resp 14

## 2015-02-24 DIAGNOSIS — G8929 Other chronic pain: Secondary | ICD-10-CM | POA: Insufficient documentation

## 2015-02-24 DIAGNOSIS — M5136 Other intervertebral disc degeneration, lumbar region: Secondary | ICD-10-CM | POA: Insufficient documentation

## 2015-02-24 DIAGNOSIS — Z79899 Other long term (current) drug therapy: Secondary | ICD-10-CM

## 2015-02-24 DIAGNOSIS — Z5181 Encounter for therapeutic drug level monitoring: Secondary | ICD-10-CM

## 2015-02-24 DIAGNOSIS — G894 Chronic pain syndrome: Secondary | ICD-10-CM | POA: Diagnosis not present

## 2015-02-24 DIAGNOSIS — M5126 Other intervertebral disc displacement, lumbar region: Secondary | ICD-10-CM

## 2015-02-24 DIAGNOSIS — M25552 Pain in left hip: Secondary | ICD-10-CM | POA: Diagnosis not present

## 2015-02-24 DIAGNOSIS — M545 Low back pain: Secondary | ICD-10-CM | POA: Insufficient documentation

## 2015-02-24 MED ORDER — TRAMADOL HCL 50 MG PO TABS: 50.0000 mg | ORAL_TABLET | Freq: Two times a day (BID) | ORAL | Status: DC

## 2015-02-24 NOTE — Progress Notes (Signed)
Subjective:    Patient ID: Justin Kidd, male    DOB: 1961-06-25, 54 y.o.   MRN: [id number]  HPI 54 year old male with history of low back pain for 20 years. He has seen a sports medicine physician as well as pain management physician, Dr. Maryjean Ka. He was also evaluated by neurosurgeon Dr. Ronnald Ramp.  Patient tried some type of injection he thinks it may have been an epidural. Dr. Ronnald Ramp did not recommend surgery. He has had knee injections for osteoarthritis 3 Synvisc injections on each side, also has had left hip trochanteric bursa injection. Patient has had nerve studies including EMG/NCV he reports negative study.  Patient has had physical therapy 2 or 3 times.  Patient used to walk. His weight has been over 250 for years. He was losing weight but then his knees started hurting him. He has tried stationary bicycling which hurts his back and he has tried elliptical which also has hurt his back. PMH:  Afib s/p ablation, Has f/u Dr Rayann Heman 8/31, on Xarelto   Pain Inventory Average Pain 8 Pain Right Now 9 My pain is constant, sharp, dull, stabbing, tingling and aching  In the last 24 hours, has pain interfered with the following? General activity 2 Relation with others 2 Enjoyment of life 2 What TIME of day is your pain at its worst? all Sleep (in general) Fair  Pain is worse with: bending, sitting and standing Pain improves with: medication and injections Relief from Meds: 0  Mobility walk without assistance how many minutes can you walk? 60 ability to climb steps?  yes do you drive?  yes Do you have any goals in this area?  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  yes  Neuro/Psych weakness numbness tingling spasms depression anxiety  Prior Studies new visit CLINICAL DATA: Low back pain for 20 years. No injury.  Intermittent bilateral leg pain.  EXAM: MRI LUMBAR SPINE WITHOUT CONTRAST  TECHNIQUE: Multiplanar, multisequence MR imaging of the lumbar spine was performed. No intravenous contrast was administered.  COMPARISON: None.  FINDINGS: Left renal cyst is present. The numbering convention used for this exam termed L5-S1 as the last intervertebral disc space. Lumbar spinal alignment is within normal limits. Spinal cord terminates posterior to the L1 vertebra. Vertebral body height and marrow signal is within normal limits.  L1-L2: Mild disc desiccation and shallow bulging without stenosis.  L2-L3: Negative.  L3-L4: Negative.  L4-L5: Disc desiccation. Tiny central protrusion. Facet hypertrophy is present. There is no significant central or lateral recess stenosis. Both neural foramina appear patent.  L5-S1: There is a right lateral disc protrusion contacting and posteriorly displacing the exited right L5 nerve lateral to the foramen. Disc is desiccated. There is also a shallow left foraminal protrusion which also contacts the exited left L5 nerve. Central canal and lateral recesses are patent. Shallow broad-based disc bulging. Degenerative endplate changes are present with bone marrow edema on both sides of the disc space on the right.  IMPRESSION: Mild lumbar spondylosis, most pronounced at L5-S1. Shallow foraminal and lateral disc protrusions potentially irritate the exiting L5 nerves.   Electronically Signed  By: Dereck Ligas M.D.  On: 12/12/2013 09:54 Physicians involved in your care new visit   Family History  Problem Relation Age of Onset  . Heart failure Mother   . Heart attack Mother 4  Widow maker MI  . Stroke Other   . Cancer Paternal Grandmother    History   Social History  . Marital Status: Divorced    Spouse Name: N/A  . Number of Children: N/A  . Years of Education: N/A   Social History Main Topics  . Smoking status:  Never Smoker   . Smokeless tobacco: Never Used  . Alcohol Use: No  . Drug Use: No  . Sexual Activity: Not on file   Other Topics Concern  . None   Social History Narrative   Pt lives in [place] with mother.  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 Shoulder surgery    . Shoulder arthroscopy with open rotator cuff repair Bilateral   . Ankle surgery Right   . Wrist surgery Right   . Tee without cardioversion N/A 12/24/2014    Procedure: TRANSESOPHAGEAL ECHOCARDIOGRAM (TEE);  Surgeon: [name], MD;  Location: [facility];  Service: Cardiovascular;  Laterality: N/A;  . Electrophysiologic study N/A 12/25/2014    Procedure: Atrial Fibrillation Ablation;  Surgeon: Thompson Grayer, MD;  Location: [facility];  Service: Cardiovascular;  Laterality: N/A;   Past Medical History  Diagnosis Date  . Atrial fibrillation   . Pneumonia   . Anxiety   . Kidney stone   . Obstructive sleep apnea     mild with AHI 10/hr now on CPAP  . Overweight   . DDD (degenerative disc disease), cervical    BP 112/77 mmHg  Pulse 100  Resp 14  SpO2 93%  Opioid Risk Score:   Fall Risk Score:  `1  Depression screen PHQ 2/9  Depression screen PHQ 2/9 02/24/2015  Decreased Interest 1  Down, Depressed, Hopeless 1  PHQ - 2 Score 2  Altered sleeping 1  Tired, decreased energy 1  Change in appetite 0  Feeling bad or failure about yourself  0  Trouble concentrating 0  Moving slowly or fidgety/restless 0  Suicidal thoughts 0  PHQ-9 Score 4  Difficult doing work/chores Not difficult at all     Review of Systems  Constitutional:       Weight gain due to less activity  Respiratory: Positive for apnea.   Cardiovascular: Positive for leg swelling.  Genitourinary:       Urinary retention  Neurological: Positive for weakness and numbness.       Tingling spasms  Psychiatric/Behavioral: Positive for dysphoric  mood. The patient is nervous/anxious.   All other systems reviewed and are negative.      Objective:   Physical Exam  Constitutional: He is oriented to person, place, and time. He appears well-developed and well-nourished.  HENT:  Head: Normocephalic and atraumatic.  Eyes: Conjunctivae and EOM are normal. Pupils are equal, round, and reactive to light.  Neck: Normal range of motion.  Cardiovascular: Normal rate.   Irreg Irreg  Pulmonary/Chest: Effort normal and breath sounds normal.  Abdominal: Soft. Bowel sounds are normal.  Musculoskeletal:       Right hip: He exhibits decreased range of motion.       Left hip: He exhibits decreased range of motion.       Right knee: Tenderness found. Medial joint line and patellar tendon tenderness noted.       Left knee: Tenderness found. Medial joint line and patellar tendon tenderness noted.  Decreased bilateral hip internal and external rotation with pain during range  of motion  Neurological: He is alert and oriented to person, place, and time.  Reflex Scores:      Tricep reflexes are 1+ on the right side and 1+ on the left side.      Bicep reflexes are 1+ on the right side and 1+ on the left side.      Brachioradialis reflexes are 1+ on the right side and 1+ on the left side.      Patellar reflexes are 2+ on the right side and 2+ on the left side.      Achilles reflexes are 2+ on the right side and 2+ on the left side. Sensation intact to pinprick bilateral C6-C7 C8 Intact at bilateral L2 L3 L5 and S1 Diminished pinprick at bilateral L4  Psychiatric: His mood appears anxious.  Nursing note and vitals reviewed.  Tenderness over the R posterolat 8th rib       Assessment & Plan:  1.  Chronic low back pain, lumbar degenerative disc and facet arthrosis with possible radiculitis.  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  Needs to be alert for  driving during the day.   Discussed treatment options including PT, medication management and injections as well as surgical referral Viewed MRI findings  Pt wishes to consider all options excluding further PT Counseled regarding lifestyle issues, driving ~07W per day and weight (~300lb)  For meds would not exceed tramadol during the day and use Schedule 3 agent at noc, will need UDS first  Referral to orthopedic spine surgery  L3,4,5 MBB may be of benefit for back pain but on Xarelto  2.  Left hip/groin pain Has clinical findings referable to Left hip, check Xray  3.  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

## 2015-02-24 NOTE — Patient Instructions (Signed)
Dr. Patrice Paradise is [facility]   Hip x-rays can be done at any time at [facility] imaging  Have prescribed tramadol may need to increase dose, this is a good daytime medicine but for nighttime use may need something stronger. Will need to wait on urine drug screen

## 2015-02-25 LAB — PMP ALCOHOL METABOLITE (ETG): Ethyl Glucuronide (EtG): NEGATIVE ng/mL

## 2015-03-01 LAB — BUPRENORPHINES (GC/LC/MS), URINE
Buprenorphine: 13.6 ng/mL — AB (ref <2)
Norbuprenorphine: 57.2 ng/mL — AB (ref <2)

## 2015-03-01 LAB — BENZODIAZEPINES (GC/LC/MS), URINE
Alprazolam metabolite (GC/LC/MS), ur confirm: 219 ng/mL — AB (ref <25)
CLONAZEPAU: NEGATIVE ng/mL (ref <25)
Flurazepam metabolite (GC/LC/MS), ur confirm: NEGATIVE ng/mL (ref <50)
Lorazepam (GC/LC/MS), ur confirm: NEGATIVE ng/mL (ref <50)
Midazolam (GC/LC/MS), ur confirm: NEGATIVE ng/mL (ref <50)
Nordiazepam (GC/LC/MS), ur confirm: NEGATIVE ng/mL (ref <50)
Oxazepam (GC/LC/MS), ur confirm: NEGATIVE ng/mL (ref <50)
TRIAZOLAMU: NEGATIVE ng/mL (ref <50)
Temazepam (GC/LC/MS), ur confirm: NEGATIVE ng/mL (ref <50)

## 2015-03-01 LAB — TRAMADOL, URINE
N-DESMETHYL-CIS-TRAMADOL: 16163 ng/mL — AB (ref <100)
Tramadol, Urine: 39249 ng/mL — AB (ref <100)

## 2015-03-02 LAB — PRESCRIPTION MONITORING PROFILE (SOLSTAS)
Amphetamine/Meth: NEGATIVE ng/mL
BARBITURATE SCREEN, URINE: NEGATIVE ng/mL
Cannabinoid Scrn, Ur: NEGATIVE ng/mL
Carisoprodol, Urine: NEGATIVE ng/mL
Cocaine Metabolites: NEGATIVE ng/mL
Creatinine, Urine: 52.92 mg/dL (ref >20.0)
ECSTASY: NEGATIVE ng/mL
Fentanyl, Ur: NEGATIVE ng/mL
Meperidine, Ur: NEGATIVE ng/mL
Methadone Screen, Urine: NEGATIVE ng/mL
NITRITES URINE, INITIAL: NEGATIVE ug/mL
Opiate Screen, Urine: NEGATIVE ng/mL
Oxycodone Screen, Ur: NEGATIVE ng/mL
Propoxyphene: NEGATIVE ng/mL
Tapentadol, urine: NEGATIVE ng/mL
Zolpidem, Urine: NEGATIVE ng/mL
pH, Initial: 4.8 pH (ref 4.5–8.9)

## 2015-03-03 ENCOUNTER — Telehealth: Payer: Self-pay | Admitting: *Deleted

## 2015-03-03 NOTE — Telephone Encounter (Signed)
Patient is calling about results of his UDS.Justin KitchenMarland Kidd

## 2015-03-03 NOTE — Telephone Encounter (Signed)
Left message for Justin Kidd to return the call to our office.  There was no name on voicemail so no details left.

## 2015-03-03 NOTE — Telephone Encounter (Signed)
Judith called back and I reviewed with him what I was seeing present in his urine.  He had very high levels of tramadol which has not been prescribed per [facility].  He also had presence of buprenorphine which could be butrans or suboxone.  Alprazolam was present as well and no hx on [facility]. I inquired if he had been prescribed these medications and he said no.  He asked what difference it would make since he was not a patient of ours before. I explained that taking unprescribed controlled substances is illegal. Be that as it may, he should have reported these medications to [place]. I have told him that Dr Letta Pate is out of the office until Monday and he will make his decision and we will let him know what he says.

## 2015-03-09 NOTE — Telephone Encounter (Signed)
Nonnarcotic only

## 2015-03-10 NOTE — Telephone Encounter (Signed)
[facility] notified 

## 2015-03-18 ENCOUNTER — Encounter: Payer: Self-pay | Admitting: Cardiology

## 2015-03-23 ENCOUNTER — Other Ambulatory Visit: Payer: Self-pay

## 2015-03-23 ENCOUNTER — Telehealth: Payer: Self-pay | Admitting: *Deleted

## 2015-03-23 DIAGNOSIS — G4733 Obstructive sleep apnea (adult) (pediatric): Secondary | ICD-10-CM

## 2015-03-23 NOTE — Addendum Note (Signed)
Addended by: Andres Ege on: 03/23/2015 10:45 AM   Modules accepted: Orders

## 2015-03-23 NOTE — Telephone Encounter (Signed)
Left message for patient to call back.  Orders have been placed. [facility] will be notified of two separate orders.

## 2015-03-23 NOTE — Telephone Encounter (Signed)
-----   Message from Sueanne Margarita, MD sent at 03/18/2015  4:43 PM EDT ----- Time spent with Oxygen sats <88% was 5 minutes and 24 seconds therefore patient qualifies for O2 at night with CPAP.  Please order O2 at 2L to bleed into CPAP at nighttime only.  Get repeat overnight pulse ox on Oxygen and CPAP

## 2015-03-28 ENCOUNTER — Other Ambulatory Visit: Payer: Self-pay | Admitting: Interventional Cardiology

## 2015-03-30 NOTE — Progress Notes (Signed)
Urine drug screen for this encounter is inconsistent for prescribed medication. Positive for buprenorphine,xanax, and tramadol all unprescribed.  (He did not schedule a follow up appt and has been notified about non narcotic tx only due to this screen)

## 2015-04-01 ENCOUNTER — Encounter: Payer: Self-pay | Admitting: Internal Medicine

## 2015-04-01 ENCOUNTER — Ambulatory Visit (INDEPENDENT_AMBULATORY_CARE_PROVIDER_SITE_OTHER): Payer: 59 | Admitting: Internal Medicine

## 2015-04-01 VITALS — BP 118/78 | HR 85 | Ht 75.0 in | Wt 312.8 lb

## 2015-04-01 DIAGNOSIS — I480 Paroxysmal atrial fibrillation: Secondary | ICD-10-CM | POA: Diagnosis not present

## 2015-04-01 MED ORDER — DILTIAZEM HCL 60 MG PO TABS
ORAL_TABLET | ORAL | Status: DC
Start: 2015-04-01 — End: 2015-11-25

## 2015-04-01 NOTE — Progress Notes (Signed)
PCP: REDMON,NOELLE, PA-C Primary Cardiologist:  Dr Nicanor Alcon is a 54 y.o. male who presents today for routine electrophysiology followup.  Since his recent ablation, the patient reports doing very well.  He denies procedure related complications.  Very pleased with results. Today, he denies symptoms of palpitations, chest pain, shortness of breath,  lower extremity edema, dizziness, presyncope, or syncope.  The patient is otherwise without complaint today.   Past Medical History  Diagnosis Date  . Atrial fibrillation   . Pneumonia   . Anxiety   . Kidney stone   . Obstructive sleep apnea     mild with AHI 10/hr now on CPAP  . Overweight   . DDD (degenerative disc disease), cervical    Past Surgical History  Procedure Laterality Date  . Shoulder surgery    . Shoulder arthroscopy with open rotator cuff repair Bilateral   . Ankle surgery Right   . Wrist surgery Right   . Tee without cardioversion N/A 12/24/2014    Procedure: TRANSESOPHAGEAL ECHOCARDIOGRAM (TEE);  Surgeon: [name], MD;  Location: [facility];  Service: Cardiovascular;  Laterality: N/A;  . Electrophysiologic study N/A 12/25/2014    Procedure: Atrial Fibrillation Ablation;  Surgeon: Thompson Grayer, MD;  Location: [facility];  Service: Cardiovascular;  Laterality: N/A;    ROS- all systems are reviewed and negatives except as per HPI above  Current Outpatient Prescriptions  Medication Sig Dispense Refill  . diltiazem (CARDIZEM) 60 MG tablet Take 1 tablet as directed as needed (Patient taking differently: Take 60 mg by mouth as needed (AFIB). Take 1 tablet as directed as needed) 120 tablet 0  . furosemide (LASIX) 20 MG tablet TAKE 1 TABLET BY MOUTH DAILY AS NEEDED FOR FLUID 30 tablet 0  . gabapentin (NEURONTIN) 300 MG capsule Take 300 mg by mouth 3 (three) times daily.     . hydrOXYzine (VISTARIL) 25 MG capsule Take 25 mg by mouth every 6 (six) hours as needed for anxiety. For anxiety  0  .  methocarbamol (ROBAXIN) 750 MG tablet Take 750 mg by mouth every 8 (eight) hours as needed for muscle spasms.   2  . pantoprazole (PROTONIX) 40 MG tablet Take 1 tablet (40 mg total) by mouth daily. 45 tablet 1  . Potassium Chloride ER 20 MEQ TBCR 1 TABLET DAILY BY MOUTH  ON THE DAYS YOU TAKE FUROSEMIDE(LASIX) 30 tablet 6  . rivaroxaban (XARELTO) 20 MG TABS tablet TAKE 1 TABLET BY MOUTH DAILY WITH SUPPER 30 tablet 3  . rOPINIRole (REQUIP) 1 MG tablet Take 1 mg by mouth 3 (three) times daily.  1   No current facility-administered medications for this visit.    Physical Exam: Filed Vitals:   04/01/15 0912  BP: 118/78  Pulse: 85  Height: 6\' 3"  (1.905 m)  Weight: 141.885 kg (312 lb 12.8 oz)    GEN- The patient is well appearing, alert and oriented x 3 today.   Head- normocephalic, atraumatic Eyes-  Sclera clear, conjunctiva pink Ears- hearing intact Oropharynx- clear Lungs- Clear to ausculation bilaterally, normal work of breathing Heart- Regular rate and rhythm, no murmurs, rubs or gallops, PMI not laterally displaced GI- soft, NT, ND, + BS Extremities- no clubbing, cyanosis, or edema  ekg today reveals sinus rhythm  Assessment and Plan:  1. afib Doing well s/p ablation No afib off of AAD therapy chads2vasc score is 0.  Therefore stop xarelto today  Return to see Dr Irish Lack in 3 months I will  see again in 6 months

## 2015-04-01 NOTE — Patient Instructions (Signed)
Medication Instructions:  1) STOP Xarelto 2) STOP Protonix  Labwork: N/A  Testing/Procedures: N/A  Follow-Up: Your physician wants you to follow-up in:  3 months with Dr. Irish Lack and in 6 months with Dr. Rayann Heman  Any Other Special Instructions Will Be Listed Below (If Applicable).

## 2015-04-01 NOTE — Addendum Note (Signed)
Addended by: Jordan Likes on: 04/01/2015 09:51 AM   Modules accepted: Orders, Medications

## 2015-04-13 ENCOUNTER — Encounter: Payer: Self-pay | Admitting: Cardiology

## 2015-04-26 IMAGING — US US RENAL
1 series · 14 of 25 positions shown · non-contrast
Comparison: None.

CLINICAL DATA: Left flank pain.  History of renal stones.

EXAM:
RENAL/URINARY TRACT ULTRASOUND COMPLETE

[Series 1: us renal · 0.27mm/px · 14 of 29 slices shown]
[im 1/29]
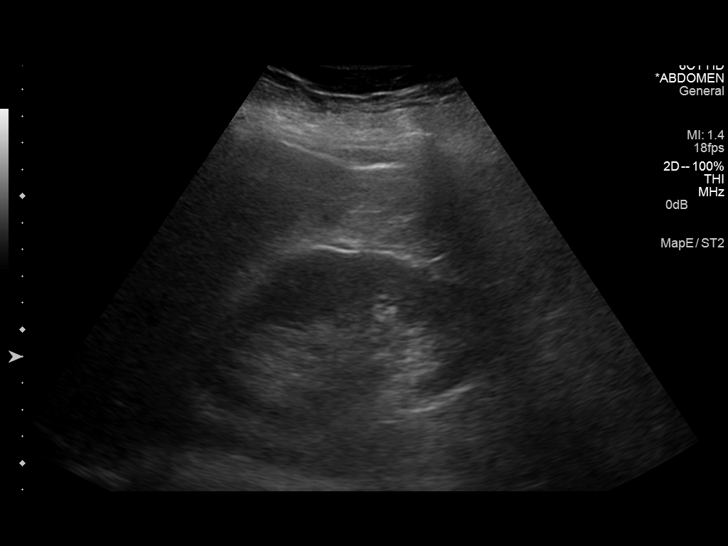
[im 3/29]
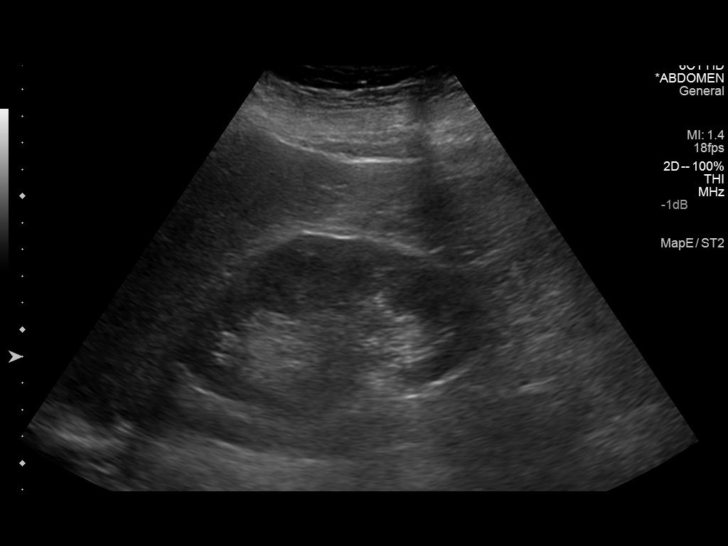
[im 5/29]
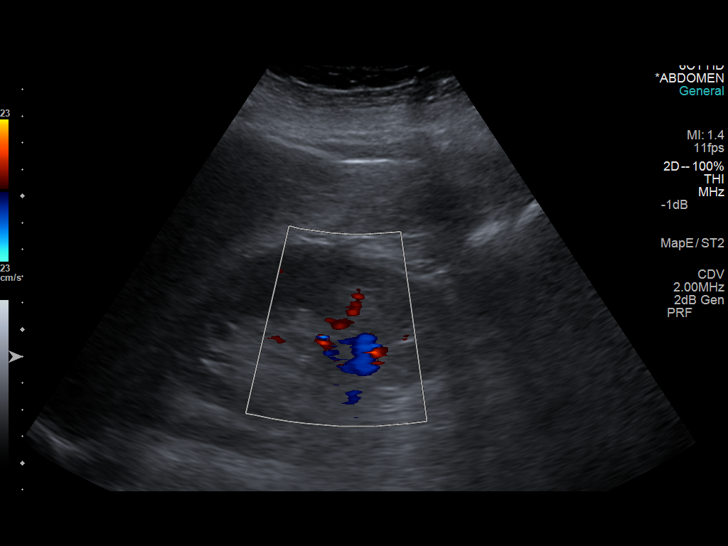
[im 8/29]
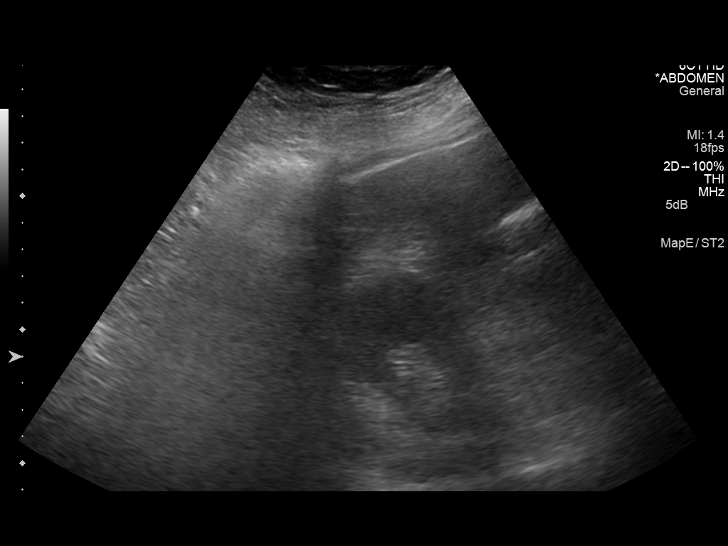
[im 10/29]
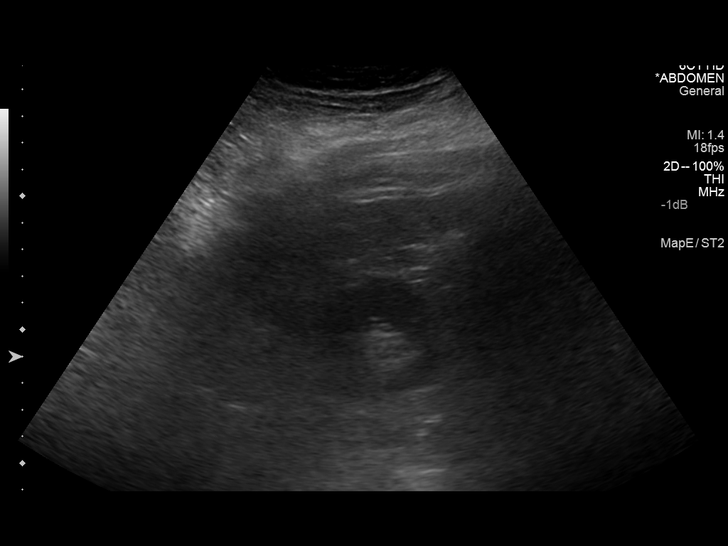
[im 11/29]
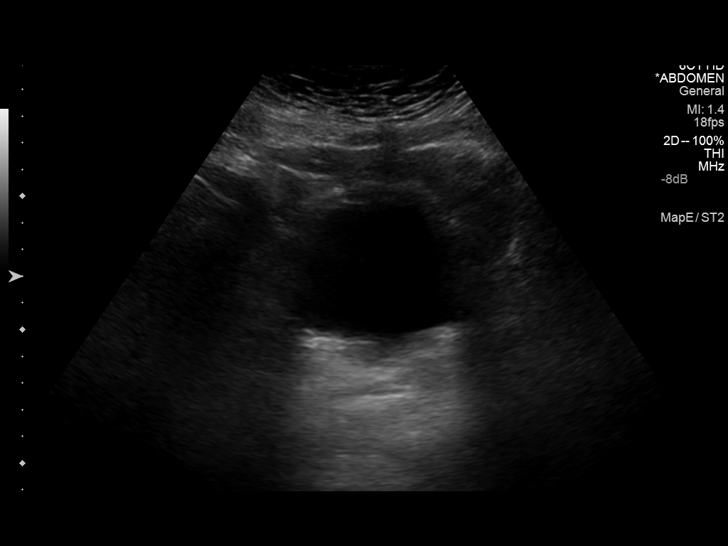
[im 13/29]
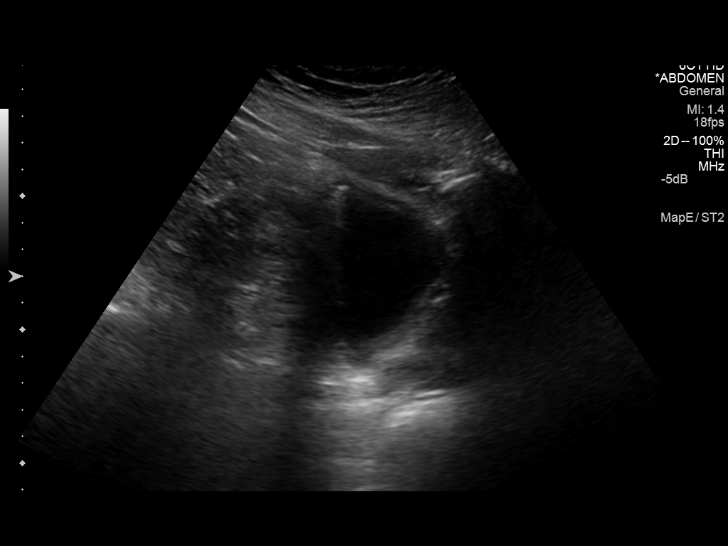
[im 16/29]
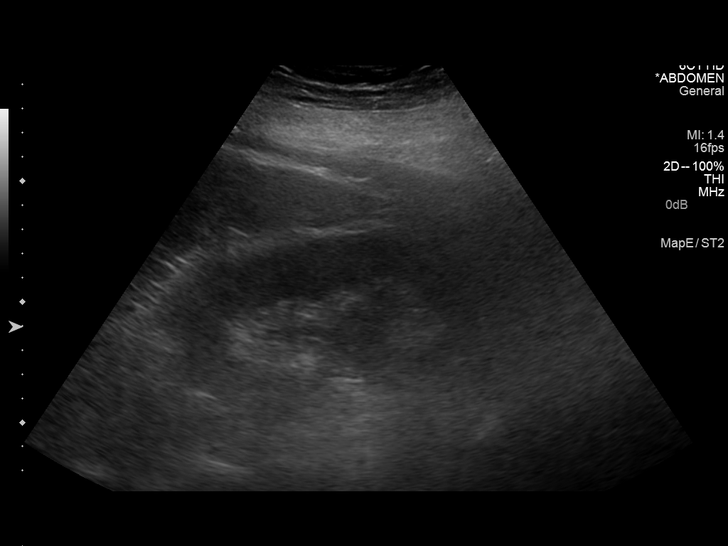
[im 18/29]
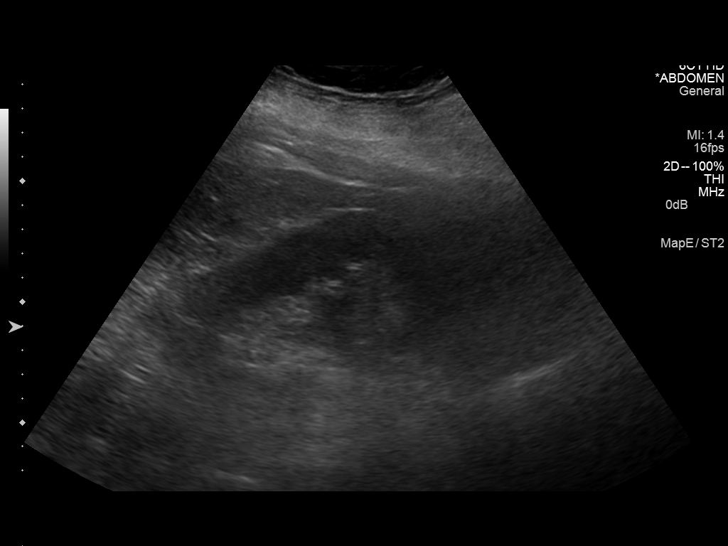
[im 19/29]
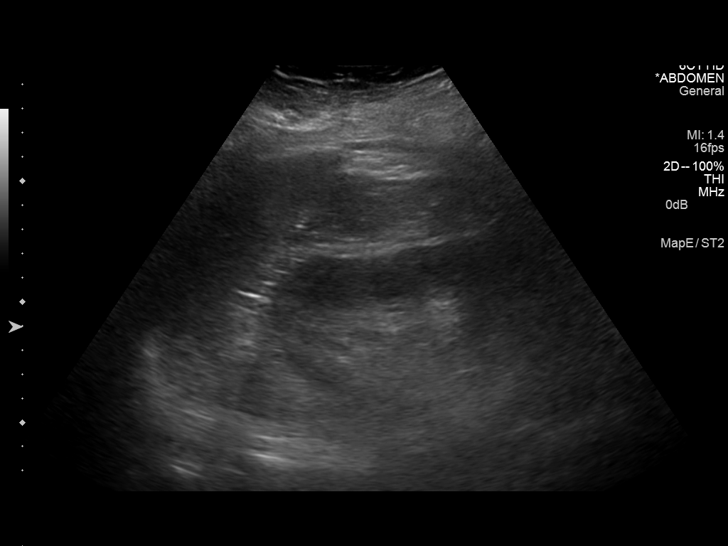
[im 22/29]
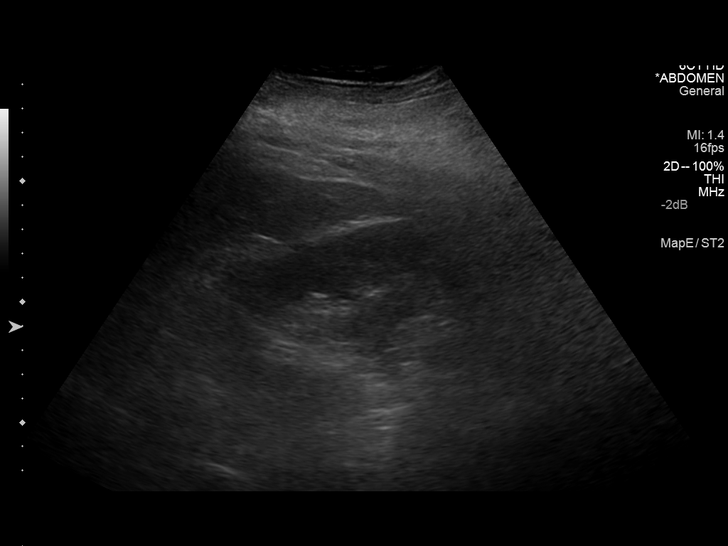
[im 24/29]
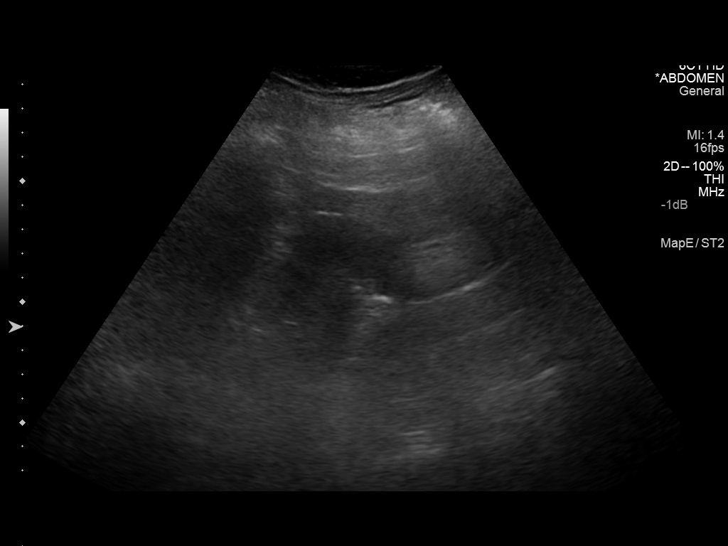
[im 26/29]
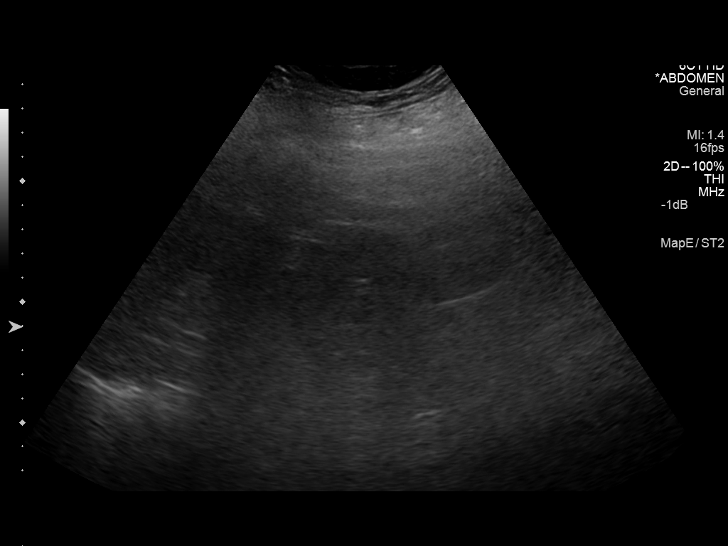
[im 29/29]
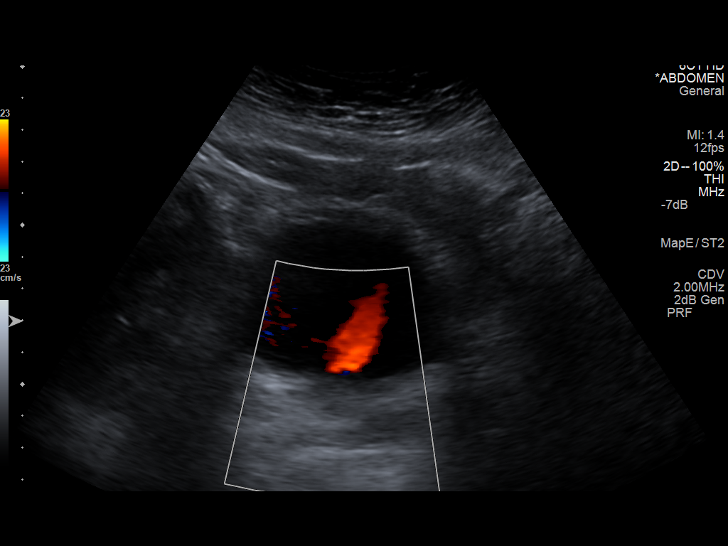

[14 of 25 positions shown; findings below may reference images not displayed]

FINDINGS: Right Kidney:

Length: 12.2 cm. Echogenicity within normal limits. No mass or
hydronephrosis visualized.

Left Kidney:

Length: 13.3 cm. Normal renal cortical thickness and echogenicity.
Mild left pelviectasis.

Bladder:

Appears normal for degree of bladder distention.
IMPRESSION: Mild left pelviectasis without causative etiology identified.

## 2015-05-02 ENCOUNTER — Other Ambulatory Visit: Payer: Self-pay | Admitting: Interventional Cardiology

## 2015-06-28 ENCOUNTER — Other Ambulatory Visit: Payer: Self-pay | Admitting: Interventional Cardiology

## 2015-07-02 ENCOUNTER — Ambulatory Visit: Payer: 59 | Admitting: Interventional Cardiology

## 2015-09-15 ENCOUNTER — Ambulatory Visit: Payer: Self-pay | Admitting: Interventional Cardiology

## 2015-10-15 ENCOUNTER — Ambulatory Visit: Payer: Self-pay | Admitting: Cardiology

## 2015-11-01 ENCOUNTER — Other Ambulatory Visit: Payer: Self-pay | Admitting: Interventional Cardiology

## 2015-11-25 ENCOUNTER — Encounter: Payer: Self-pay | Admitting: Interventional Cardiology

## 2015-11-25 ENCOUNTER — Ambulatory Visit (INDEPENDENT_AMBULATORY_CARE_PROVIDER_SITE_OTHER): Payer: BLUE CROSS/BLUE SHIELD | Admitting: Interventional Cardiology

## 2015-11-25 VITALS — BP 128/88 | HR 73 | Ht 75.0 in | Wt 317.6 lb

## 2015-11-25 DIAGNOSIS — I480 Paroxysmal atrial fibrillation: Secondary | ICD-10-CM | POA: Diagnosis not present

## 2015-11-25 DIAGNOSIS — E669 Obesity, unspecified: Secondary | ICD-10-CM | POA: Diagnosis not present

## 2015-11-25 DIAGNOSIS — I5032 Chronic diastolic (congestive) heart failure: Secondary | ICD-10-CM

## 2015-11-25 DIAGNOSIS — G4733 Obstructive sleep apnea (adult) (pediatric): Secondary | ICD-10-CM | POA: Diagnosis not present

## 2015-11-25 LAB — BASIC METABOLIC PANEL WITH GFR
BUN: 15 mg/dL (ref 7–25)
CO2: 27 mmol/L (ref 20–31)
Calcium: 9.4 mg/dL (ref 8.6–10.3)
Chloride: 100 mmol/L (ref 98–110)
Creat: 0.74 mg/dL (ref 0.70–1.33)
Glucose, Bld: 102 mg/dL — ABNORMAL HIGH (ref 65–99)
Potassium: 4.2 mmol/L (ref 3.5–5.3)
Sodium: 138 mmol/L (ref 135–146)

## 2015-11-25 MED ORDER — FUROSEMIDE 40 MG PO TABS: ORAL_TABLET | ORAL | Status: DC

## 2015-11-25 MED ORDER — DILTIAZEM HCL 60 MG PO TABS: 60.0000 mg | ORAL_TABLET | ORAL | Status: DC

## 2015-11-25 MED ORDER — POTASSIUM CHLORIDE ER 20 MEQ PO TBCR: EXTENDED_RELEASE_TABLET | ORAL | Status: DC

## 2015-11-25 NOTE — Progress Notes (Signed)
Patient ID: Justin Kidd, male   DOB: October 04, 1960, 55 y.o.   MRN: [id number]     Cardiology Office Note   Date:  11/25/2015   ID:  WAVERLY CHAVARRIA, DOB 1961-02-20, MRN [id number]  PCP:  REDMON,NOELLE, PA-C    Chief Complaint  Patient presents with  . Follow-up    POST ABLASION   AFib  Wt Readings from Last 3 Encounters:  11/25/15 317 lb 9.6 oz (144.062 kg)  04/01/15 312 lb 12.8 oz (141.885 kg)  01/22/15 319 lb 6.4 oz (144.879 kg)       History of Present Illness: Justin Kidd is a 55 y.o. male  with a h/o PAF since age 80. He failed AAD (Flecainide) and had an ablationin 2016.  This has been successful.  He is using CPAP for OSA.  He had to take steroids for a back problem. He will need surgery per his report.    He has not used any prn diltiazem.  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  Wants to start exercise.    He can walk despite his back back.  He has been using Lasix frequently.     Past Medical History  Diagnosis Date  . Atrial fibrillation ([place])   . Pneumonia   . Anxiety   . Kidney stone   . Obstructive sleep apnea     mild with AHI 10/hr now on CPAP  . Overweight   . DDD (degenerative disc disease), cervical     Past Surgical History  Procedure Laterality Date  . Shoulder surgery    . Shoulder arthroscopy with open rotator cuff repair Bilateral   . Ankle surgery Right   . Wrist surgery Right   . Tee without cardioversion N/A 12/24/2014    Procedure: TRANSESOPHAGEAL ECHOCARDIOGRAM (TEE);  Surgeon: [name], MD;  Location: [place];  Service: Cardiovascular;  Laterality: N/A;  . Electrophysiologic study N/A 12/25/2014    Procedure: Atrial Fibrillation Ablation;  Surgeon: Thompson Grayer, MD;  Location: [facility];  Service: Cardiovascular;  Laterality: N/A;     Current Outpatient Prescriptions  Medication Sig Dispense Refill  . diclofenac (VOLTAREN) 75 MG EC tablet Take 75 mg by mouth 3 (three) times daily.   0  . diltiazem  (CARDIZEM) 60 MG tablet Take 60 mg by mouth as directed. FOR AFIB    . Potassium Chloride ER 20 MEQ TBCR 1 TABLET DAILY BY MOUTH  ON THE DAYS YOU TAKE FUROSEMIDE(LASIX) 30 tablet 6  . rOPINIRole (REQUIP) 1 MG tablet Take 1 mg by mouth 3 (three) times daily.  1  . furosemide (LASIX) 20 MG tablet TAKE 1 TABLET BY MOUTH DAILY AS NEEDED FOR FLUID 30 tablet 1  . gabapentin (NEURONTIN) 300 MG capsule Take 300 mg by mouth 3 (three) times daily.     . hydrOXYzine (VISTARIL) 25 MG capsule Take 25 mg by mouth every 6 (six) hours as needed for anxiety. For anxiety  0  . tiZANidine (ZANAFLEX) 4 MG tablet Take 4 mg by mouth 3 (three) times daily.   0   No current facility-administered medications for this visit.    Allergies:   Review of patient's allergies indicates no known allergies.    Social History:  The patient  reports that he has never smoked. He has never used smokeless tobacco. He reports that he does not drink alcohol or use illicit drugs.   Family History:  The patient's family history includes Cancer  in his paternal grandmother; Heart attack (age of onset: 53) in his mother; Heart failure in his mother; Stroke in his other.    ROS:  Please see the history of present illness.   Otherwise, review of systems are positive for weight gain.   All other systems are reviewed and negative.    PHYSICAL EXAM: VS:  BP 128/88 mmHg  Pulse 73  Ht '6\' 3"'$  (1.905 m)  Wt 317 lb 9.6 oz (144.062 kg)  BMI 39.70 kg/m2  SpO2 97% , BMI Body mass index is 39.7 kg/(m^2). GEN: Well nourished, well developed, in no acute distress HEENT: normal Neck: no JVD, carotid bruits, or masses Cardiac: RRR; no murmurs, rubs, or gallops,no edema  Respiratory:  clear to auscultation bilaterally, normal work of breathing GI: soft, nontender, nondistended, + BS, obese MS: no deformity or atrophy Skin: warm and dry, no rash Neuro:  Strength and sensation are intact Psych: euthymic mood, full affect   EKG:   The ekg  ordered today demonstrates NSR, chronic inferior ST elevation   Recent Labs: 12/15/2014: Hemoglobin 14.4; Platelets 232.0 12/26/2014: BUN 8; Creatinine, Ser 0.77; Potassium 3.4*; Sodium 133*   Lipid Panel No results found for: CHOL, TRIG, HDL, CHOLHDL, VLDL, LDLCALC, LDLDIRECT   Other studies Reviewed: Additional studies/ records that were reviewed today with results demonstrating: .   ASSESSMENT AND PLAN:  1.  AFib: Maintaining sinus rhythm post ablation. Now off anticoagulation. 2. Chronic diastolic heart failure: Continue Lasix as needed. He needs to take his Lasix with potassium. Will check be met today. He has not always been taking the potassium pills.  Appears euvolemic. 3. Obesity: Once his back is better, he will try to exercise more. He is trying to improve his diet. He did ask about diet drugs but I recommended that he stick to a healthier diet more exercise. 4. OSA: I encouraged use of CPAP. He uses regularly.   Current medicines are reviewed at length with the patient today.  The patient concerns regarding his medicines were addressed.  The following changes have been made:  Increase Lasix tablet strength  Labs/ tests ordered today include:  Orders Placed This Encounter  Procedures  . EKG 12-Lead    Recommend 150 minutes/week of aerobic exercise Low fat, low carb, high fiber diet recommended  Disposition:   FU in  1 year   Teresita Madura., MD  11/25/2015 10:54 AM    [facility] [address], [place], [place]  [address] Phone: [phone]; Fax: [phone]

## 2015-11-25 NOTE — Patient Instructions (Signed)
Medication Instructions:  Increase Furosemide to 40 mg-take as directed-All of your other medications remain the same.  Labwork: BMET today  Testing/Procedures: None  Follow-Up: Your physician wants you to follow-up in: 1 year. You will receive a reminder letter in the mail two months in advance. If you don't receive a letter, please call our office to schedule the follow-up appointment.     If you need a refill on your cardiac medications before your next appointment, please call your pharmacy.

## 2016-12-06 ENCOUNTER — Other Ambulatory Visit: Payer: Self-pay | Admitting: Interventional Cardiology

## 2017-04-13 ENCOUNTER — Telehealth: Payer: Self-pay | Admitting: Interventional Cardiology

## 2017-04-13 NOTE — Telephone Encounter (Signed)
New message       *STAT* If patient is at the pharmacy, call can be transferred to refill team.   1. Which medications need to be refilled? (please list name of each medication and dose if known) dilitiazem  60mg   2. Which pharmacy/location (including street and city if local pharmacy) is medication to be sent to?  [facility] 3. Do they need a 30 day or 90 day supply? 30 day----pt has been out of medication for 3 days.  He has an appt with dr Irish Lack on 04-20-17

## 2017-04-14 ENCOUNTER — Other Ambulatory Visit: Payer: Self-pay

## 2017-04-14 MED ORDER — DILTIAZEM HCL 60 MG PO TABS: 60.0000 mg | ORAL_TABLET | ORAL | 0 refills | Status: DC

## 2017-04-14 NOTE — Telephone Encounter (Signed)
F/u Message  Pt cal to f/u on refill please call back to discuss. Pt also would like to know if he could get a 90 day supply. Please call back to discuss

## 2017-04-20 ENCOUNTER — Ambulatory Visit (INDEPENDENT_AMBULATORY_CARE_PROVIDER_SITE_OTHER): Payer: Self-pay | Admitting: Interventional Cardiology

## 2017-04-20 ENCOUNTER — Encounter: Payer: Self-pay | Admitting: Interventional Cardiology

## 2017-04-20 ENCOUNTER — Telehealth: Payer: Self-pay | Admitting: Interventional Cardiology

## 2017-04-20 VITALS — BP 120/82 | HR 95 | Ht 75.0 in | Wt 349.6 lb

## 2017-04-20 DIAGNOSIS — G4733 Obstructive sleep apnea (adult) (pediatric): Secondary | ICD-10-CM

## 2017-04-20 DIAGNOSIS — I5032 Chronic diastolic (congestive) heart failure: Secondary | ICD-10-CM

## 2017-04-20 DIAGNOSIS — I480 Paroxysmal atrial fibrillation: Secondary | ICD-10-CM

## 2017-04-20 NOTE — Progress Notes (Signed)
Cardiology Office Note   Date:  04/20/2017   ID:  Justin Kidd, DOB 1961-07-03, MRN [id number]  PCP:  Lennie Odor, PA-C    No chief complaint on file. AFib   Wt Readings from Last 3 Encounters:  04/20/17 (!) 349 lb 9.6 oz (158.6 kg)  11/25/15 (!) 317 lb 9.6 oz (144.1 kg)  04/01/15 (!) 312 lb 12.8 oz (141.9 kg)       History of Present Illness: Justin Kidd is a 56 y.o. male  with a h/o PAF since age 20. He failed AAD (Flecainide) and had an ablationin 2016.  This has been successful.  He is using CPAP for OSA.  He has had back problems in the past requiring steroids and possibly surgery.   A few days ago, he had some palpitaitons during the day.  He was doing his usual job which is not strenuous.  He did not have up to date diltiazem.  He had some SHOB.   It felt like is prior AFib.    Denies : Chest pain. Dizziness. Leg edema. Nitroglycerin use. Orthopnea.  Paroxysmal nocturnal dyspnea. Shortness of breath. Syncope.   He is getting back into walking.    He has had some issues with constipation.   No palpitations in the last few days.     He uses CPAP for sleep apnea and is tolerating this well.       Past Medical History:  Diagnosis Date  . Anxiety   . Atrial fibrillation ([place])   . DDD (degenerative disc disease), cervical   . Kidney stone   . Obstructive sleep apnea    mild with AHI 10/hr now on CPAP  . Overweight   . Pneumonia     Past Surgical History:  Procedure Laterality Date  . ANKLE SURGERY Right   . ELECTROPHYSIOLOGIC STUDY N/A 12/25/2014   Procedure: Atrial Fibrillation Ablation;  Surgeon: Thompson Grayer, MD;  Location: [facility];  Service: Cardiovascular;  Laterality: N/A;  . SHOULDER ARTHROSCOPY WITH OPEN ROTATOR CUFF REPAIR Bilateral   . SHOULDER SURGERY    . TEE WITHOUT CARDIOVERSION N/A 12/24/2014   Procedure: TRANSESOPHAGEAL ECHOCARDIOGRAM (TEE);  Surgeon: [name], MD;  Location: [place];  Service: Cardiovascular;   Laterality: N/A;  . WRIST SURGERY Right      Current Outpatient Prescriptions  Medication Sig Dispense Refill  . diltiazem (CARDIZEM) 60 MG tablet Take 1 tablet (60 mg total) by mouth as directed. FOR AFIB 30 tablet 0  . furosemide (LASIX) 40 MG tablet TAKE 1 TABLET BY MOUTH DAILY AS NEEDED FOR FLUID 30 tablet 0  . METHADONE HCL PO Take 130 mg by mouth daily.    Marland Kitchen tiZANidine (ZANAFLEX) 4 MG tablet Take 4 mg by mouth 3 (three) times daily.   0   No current facility-administered medications for this visit.     Allergies:   Patient has no known allergies.    Social History:  The patient  reports that he has never smoked. He has never used smokeless tobacco. He reports that he does not drink alcohol or use drugs.   Family History:  The patient's family history includes Cancer in his paternal grandmother; Heart attack (age of onset: 62) in his mother; Heart failure in his mother; Stroke in his other.    ROS:  Please see the history of present illness.   Otherwise, review of systems are positive for occasional edema, constipation, weight gain.   All other systems  are reviewed and negative.    PHYSICAL EXAM: VS:  BP 120/82   Pulse 95   Ht 6\' 3"  (1.905 m)   Wt (!) 349 lb 9.6 oz (158.6 kg)   SpO2 92%   BMI 43.70 kg/m  , BMI Body mass index is 43.7 kg/m. GEN: Well nourished, well developed, in no acute distress  HEENT: normal  Neck: no JVD, carotid bruits, or masses Cardiac: RRR; no murmurs, rubs, or gallops,; 1+ bilateral lower extremity edema  Respiratory:  clear to auscultation bilaterally, normal work of breathing GI: soft, nontender, nondistended, + BS MS: no deformity or atrophy  Skin: warm and dry, no rash Neuro:  Strength and sensation are intact Psych: euthymic mood, full affect   EKG:   The ekg ordered today demonstrates NSR, no ST changes, QT interveal longer than prior ECG from 2017   Recent Labs: No results found for requested labs within last 8760 hours.    Lipid Panel No results found for: CHOL, TRIG, HDL, CHOLHDL, VLDL, LDLCALC, LDLDIRECT   Other studies Reviewed: Additional studies/ records that were reviewed today with results demonstrating: .   ASSESSMENT AND PLAN:  1. AFib: Plan for monitor.  He has had recurrent AFib sx for the first time since ablation in 2016.  Plan for lifewatch monitor to look for AFib.  Restart aspirin 81 mg dialy.  He is in agreement.  No bleeding problems.  He will also stop ibuprofen and start acetaminophen.  If we document AFib, refer back to Dr. Rayann Heman. 2. Chronic diastolic heart failure:  Mild volume overload.  LE edema present.  COntinue Lasix.  Low salt diet. 3. OSA: COntinue using CPAP.   4. Obesity: We spoke about weight loss.  THis will help a lot of his health problems.     Current medicines are reviewed at length with the patient today.  The patient concerns regarding his medicines were addressed.  The following changes have been made:  Start aspirin 81 mg daily  Labs/ tests ordered today include: monitor No orders of the defined types were placed in this encounter.   Recommend 150 minutes/week of aerobic exercise Low fat, low carb, high fiber diet recommended  Disposition:   FU in based on monitor results   Signed, Larae Grooms, MD  04/20/2017 12:04 PM    [facility] [address], [place],   [address] Phone: [phone]; Fax: [phone]

## 2017-04-20 NOTE — Patient Instructions (Signed)
Medication Instructions:  Your physician recommends that you continue on your current medications as directed. Please refer to the Current Medication list given to you today.   Labwork: None ordered  Testing/Procedures: Your physician has recommended that you wear an [facility] event monitor. Event monitors are medical devices that record the heart's electrical activity. Doctors most often [place] these monitors to diagnose arrhythmias. Arrhythmias are problems with the speed or rhythm of the heartbeat. The monitor is a small, portable device. You can wear one while you do your normal daily activities. This is usually used to diagnose what is causing palpitations/syncope (passing out).    Follow-Up: Based on test results   Any Other Special Instructions Will Be Listed Below (If Applicable).     If you need a refill on your cardiac medications before your next appointment, please call your pharmacy.

## 2017-04-20 NOTE — Telephone Encounter (Signed)
Patient is Self pay and was giving the application for [employer] ([employer]) monitor.  Patient is to filled the application out and return to me.  When I received the application back  I will send to [place] or Juliann Pulse to enrolled in system and faxed to [facility].   If approve patient will be notified by [employer].  Also patient was giving the application for [facility] hardship program.

## 2017-05-11 ENCOUNTER — Other Ambulatory Visit: Payer: Self-pay | Admitting: Interventional Cardiology

## 2017-06-14 ENCOUNTER — Other Ambulatory Visit: Payer: Self-pay | Admitting: Interventional Cardiology

## 2017-08-25 ENCOUNTER — Encounter: Payer: Self-pay | Admitting: Physician Assistant

## 2018-04-12 ENCOUNTER — Telehealth: Payer: Self-pay | Admitting: Interventional Cardiology

## 2018-04-12 MED ORDER — DILTIAZEM HCL ER COATED BEADS 120 MG PO TB24: 240.0000 mg | ORAL_TABLET | Freq: Every day | ORAL | 11 refills | Status: DC

## 2018-04-12 NOTE — Telephone Encounter (Signed)
New Message   Patient c/o Palpitations:  High priority if patient c/o lightheadedness, shortness of breath, or chest pain  1) How long have you had palpitations/irregular HR/ Afib? Are you having the symptoms now? About a day and 1/2. Patient says he is possibly in afib now  2) Are you currently experiencing lightheadedness, SOB or CP? No   3) Do you have a history of afib (atrial fibrillation) or irregular heart rhythm? yes  4) Have you checked your BP or HR? (document readings if available): no   5) Are you experiencing any other symptoms? No    Pt c/o medication issue:  1. Name of Medication: diltiazem (CARDIZEM) 60 MG tablet  2. How are you currently taking this medication (dosage and times per day)? 1 tablet as needed  3. Are you having a reaction (difficulty breathing--STAT)?   4. What is your medication issue? Patient is wondering if he needs to go back to taking the diltiazem 240mg  instead. Please call.

## 2018-04-12 NOTE — Telephone Encounter (Signed)
Spoke with the patient about his symptoms. He believes he is back in afib. He felt palpitations 2 days ago and last night. When the patient took his 60 mg diltiazem the symptoms went away. Currently on the phone the patient believes he is not in afib. The patient requested going back on diltiazem  240 mg daily.  The patient had persistent afib for many years and had an ablation in 2016. 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   Spoke with Dr. Burt Knack (DOD), he stated to discontinue the 60 mg diltiazem and start taking 240 mg diltiazem daily and follow up with Dr. Rayann Heman.   The patient took himself out of work 2 days ago. He asked if he could go back to work, he works at [employer]. Dr. Burt Knack said it would be fine. A letter is being prepared and placed up front for the patient to pickup tomorrow. A message was sent to EP scheduling.

## 2018-04-13 MED ORDER — DILTIAZEM HCL ER COATED BEADS 240 MG PO CP24: 240.0000 mg | ORAL_CAPSULE | Freq: Every day | ORAL | 3 refills | Status: DC

## 2018-04-13 NOTE — Telephone Encounter (Signed)
Spoke with patient, he requested to be on generic instead of brand, resent prescription corrected.

## 2018-04-13 NOTE — Addendum Note (Signed)
Addended by: Sarina Ill on: 04/13/2018 09:47 AM   Modules accepted: Orders

## 2018-04-17 ENCOUNTER — Telehealth: Payer: Self-pay | Admitting: Interventional Cardiology

## 2018-04-17 DIAGNOSIS — R002 Palpitations: Secondary | ICD-10-CM

## 2018-04-17 DIAGNOSIS — I480 Paroxysmal atrial fibrillation: Secondary | ICD-10-CM

## 2018-04-17 NOTE — Telephone Encounter (Signed)
Returned call to patient. Patient states that he has been having intermittent episodes of palpitations that feel similar to his Afib. Patient is s/p ablation in 2016. Patient denies any SOB, chest pain, or any other Sx. Patient called in on 9/12 and spoke to triage RN requesting to go back to his higher dose of diltiazem. Triage RN reviewed with DOD who gave verbal order for diltiazem 240 mg and instructed for the patient to f/u with Dr. Rayann Heman. Patient was scheduled to see Ermalinda Barrios, PA on 10/8. Patient states that his episodes were more frequent before he increased his diltiazem and are not happening as often now, but would like to know if he should restart anticoagulation. No documented Afib since ablation. Discussed with Ermalinda Barrios, PA. Patient will need a monitor to evaluate for Afib. Patient will need to see Dr. Rayann Heman after.

## 2018-04-17 NOTE — Telephone Encounter (Signed)
New Message       Patient c/o Palpitations:  High priority if patient c/o lightheadedness, shortness of breath, or chest pain  1) How long have you had palpitations/irregular HR/ Afib? Are you having the symptoms now? today  2) Are you currently experiencing lightheadedness, SOB or CP? Patient is at work  3) Do you have a history of afib (atrial fibrillation) or irregular heart rhythm? Yes  4) Have you checked your BP or HR? (document readings if available): No   5) Are you experiencing any other symptoms? No              Per patient father patient is at work.

## 2018-04-17 NOTE — Telephone Encounter (Signed)
New Message:    Pt said before his surgery he was on Diltiazem and a blood thinner. He wants to know since he is getting ready to start back on Diltiizem should he go back on his blood thinner. He said to please leave a detailed message if he does not answer. It is hard for him to answer the phone while he is at work.

## 2018-04-17 NOTE — Telephone Encounter (Signed)
Attempted to return call to patient but there was no answer. Left message for patient to call back. 

## 2018-04-18 NOTE — Telephone Encounter (Signed)
Follow up    Patient is returning call. He asked that a detail message be left because he can not answer his phone during the day and can only call on his lunch.

## 2018-04-18 NOTE — Telephone Encounter (Signed)
Left message for patient to call back  

## 2018-04-18 NOTE — Telephone Encounter (Signed)
Returned call to patient and made a detailed message on VM letting him know that he will need a monitor to evaluate for Afib. Patient will need to see Dr. Rayann Heman after. Order for monitor placed. Appointment slot held on 10/23.

## 2018-04-20 NOTE — Telephone Encounter (Signed)
Called patient. Made him aware that he will need to wear a 30-day event monitor, since he is not having his Sx everyday, to evaluate for Afib. Made him aware that he will need to f/u with Dr. Rayann Heman after the monitor is complete. Appointment for monitor is on 05/03/18 at 9:00 AM and appointment with Dr. Rayann Heman will be 06/06/18 at 9:00 AM. Spoke with Lenna Sciara, EP scheduler who will put the patient on the schedule. Instructed the patient to let us know if his Sx change or worsen before then. Patient verbalized understanding and thanked me for the call.

## 2018-04-22 ENCOUNTER — Telehealth: Payer: Self-pay | Admitting: Cardiology

## 2018-04-22 NOTE — Telephone Encounter (Signed)
Patient called in reporting he was concerned about working and his Afib. States he has a hx of Afib and ablation in the past. Was recently restarted on Dilt 240mg  with PRN Dilt and planned for a cardiac monitor with follow up with Dr. Rayann Heman. States since Saturday he feels palpitations and like he is in and out of Afib again. Tired and short of breath at times. No pain. Wanting to know if he should be out of work until seen. I advised to take his PRN Dilt and if symptoms worsened he should come in for further evaluation. Otherwise, I would send a message to the office to attempt sooner appt within the next 2-3 days with monitor. Patient understood and was agreeable to this plan. Hx of low ChadsVasc score and use of daily 81mg  ASA in the past.   Reino Bellis NP

## 2018-04-23 ENCOUNTER — Telehealth: Payer: Self-pay

## 2018-04-23 ENCOUNTER — Telehealth: Payer: Self-pay | Admitting: Internal Medicine

## 2018-04-23 NOTE — Telephone Encounter (Signed)
Per review of recent phone calls-  Pt should only be taking one capsule of Diltiazem CD 240 mg daily.  Pt was NOT advised to take any short acting diltiazem.  Left CVM for Pt.  Advised at this time he has been instructed to take Diltiazem CD 240 mg one capsule daily. If Pt feels he needs to be seen sooner he can call afib clinic-phone number given.  Pt to see Dr. Rayann Heman on 04/27/2018.  Further medication changes will be made at that time.

## 2018-04-23 NOTE — Telephone Encounter (Signed)
Pt called over the weekend and was told by PA Reino Bellis to make appt asap. Pt got in 1115am 04-27-18 with Allred. He has question regarding his Diltiazem dosage, he is to take two but has been taking 3-4 while in Afib, he is asking how many should he be taking, and will now need a refill since took more that prescribed. Ammie Ferrier, he has 18 pills left. Pls advise

## 2018-04-23 NOTE — Telephone Encounter (Signed)
Call back received from Pt.  Advised per review of recent records Pt should only be taking Diltiazem CD 240 mg daily.  Pt states he does not remember nurse telling him not to take PRN diltiazem.  Advised Pt if he could not wait until 04/27/2018 to see Dr. Rayann Heman he could make appt with Afib clinic.  Pt to call afib clinic to make sooner appt.

## 2018-04-24 ENCOUNTER — Ambulatory Visit (HOSPITAL_COMMUNITY)
Admission: RE | Admit: 2018-04-24 | Discharge: 2018-04-24 | Disposition: A | Discharge status: Home / Self Care | Payer: Managed Care, Other (non HMO) | Source: Ambulatory Visit | Attending: Nurse Practitioner | Admitting: Nurse Practitioner

## 2018-04-24 ENCOUNTER — Encounter (HOSPITAL_COMMUNITY): Payer: Self-pay | Admitting: Nurse Practitioner

## 2018-04-24 VITALS — BP 164/94 | HR 107 | Ht 75.0 in | Wt 327.0 lb

## 2018-04-24 DIAGNOSIS — G4733 Obstructive sleep apnea (adult) (pediatric): Secondary | ICD-10-CM | POA: Diagnosis not present

## 2018-04-24 DIAGNOSIS — Z823 Family history of stroke: Secondary | ICD-10-CM | POA: Insufficient documentation

## 2018-04-24 DIAGNOSIS — I480 Paroxysmal atrial fibrillation: Secondary | ICD-10-CM

## 2018-04-24 DIAGNOSIS — R002 Palpitations: Secondary | ICD-10-CM | POA: Diagnosis present

## 2018-04-24 DIAGNOSIS — Z8249 Family history of ischemic heart disease and other diseases of the circulatory system: Secondary | ICD-10-CM | POA: Diagnosis not present

## 2018-04-24 DIAGNOSIS — Z79899 Other long term (current) drug therapy: Secondary | ICD-10-CM | POA: Insufficient documentation

## 2018-04-24 DIAGNOSIS — Z87442 Personal history of urinary calculi: Secondary | ICD-10-CM | POA: Diagnosis not present

## 2018-04-24 DIAGNOSIS — F419 Anxiety disorder, unspecified: Secondary | ICD-10-CM | POA: Diagnosis not present

## 2018-04-24 DIAGNOSIS — Z79891 Long term (current) use of opiate analgesic: Secondary | ICD-10-CM | POA: Diagnosis not present

## 2018-04-24 DIAGNOSIS — Z9889 Other specified postprocedural states: Secondary | ICD-10-CM | POA: Diagnosis not present

## 2018-04-24 DIAGNOSIS — I4891 Unspecified atrial fibrillation: Secondary | ICD-10-CM | POA: Insufficient documentation

## 2018-04-24 DIAGNOSIS — Z809 Family history of malignant neoplasm, unspecified: Secondary | ICD-10-CM | POA: Insufficient documentation

## 2018-04-24 LAB — COMPREHENSIVE METABOLIC PANEL
ALBUMIN: 3.8 g/dL (ref 3.5–5.0)
ALK PHOS: 133 U/L — AB (ref 38–126)
ALT: 17 U/L (ref 0–44)
AST: 35 U/L (ref 15–41)
Anion gap: 13 (ref 5–15)
BUN: 9 mg/dL (ref 6–20)
CALCIUM: 9.4 mg/dL (ref 8.9–10.3)
CO2: 25 mmol/L (ref 22–32)
Chloride: 96 mmol/L — ABNORMAL LOW (ref 98–111)
Creatinine, Ser: 0.79 mg/dL (ref 0.61–1.24)
GFR calc Af Amer: >60 mL/min (ref >60)
GFR calc non Af Amer: >60 mL/min (ref >60)
Glucose, Bld: 252 mg/dL — ABNORMAL HIGH (ref 70–99)
Potassium: 4 mmol/L (ref 3.5–5.1)
SODIUM: 134 mmol/L — AB (ref 135–145)
Total Bilirubin: 1 mg/dL (ref 0.3–1.2)
Total Protein: 7.4 g/dL (ref 6.5–8.1)

## 2018-04-24 LAB — CBC
HCT: 45.5 % (ref 39.0–52.0)
HEMOGLOBIN: 15 g/dL (ref 13.0–17.0)
MCH: 30.2 pg (ref 26.0–34.0)
MCHC: 33 g/dL (ref 30.0–36.0)
MCV: 91.5 fL (ref 78.0–100.0)
Platelets: 294 10*3/uL (ref 150–400)
RBC: 4.97 MIL/uL (ref 4.22–5.81)
RDW: 12.1 % (ref 11.5–15.5)
WBC: 8.5 10*3/uL (ref 4.0–10.5)

## 2018-04-24 LAB — TSH: TSH: 0.541 u[IU]/mL (ref 0.350–4.500)

## 2018-04-24 NOTE — Patient Instructions (Signed)
Return zio patch on 05/08/2018 via box provided

## 2018-04-24 NOTE — Progress Notes (Signed)
Primary Care Physician: Lennie Odor, PA-C Referring Physician: Dr. Nils Pyle is a 57 y.o. male with a h/o afib, s/p ablation in 2016. He is in the afib clinic for evaluation for intermittent  palpitations for 2 weeks. This felt like afib to the pt. He has not felt well for the last 2 weeks and has found it difficult for him to do his job. He has not checked his V/S to see if he is running fast. He has gained weight since his ablation, now at 327 lbs. He states that he does not use alcohol, no excessive caffeine, no tobacco. He does use CPAP. He is complaining  about being very cold, wrapped up in a blanket today, perspiring, but is afebrile.Denies recent illness.  Today, he denies symptoms of palpitations, chest pain, shortness of breath, orthopnea, PND, lower extremity edema, dizziness, presyncope, syncope, or neurologic sequela. The patient is tolerating medications without difficulties and is otherwise without complaint today.   Past Medical History:  Diagnosis Date  . Anxiety   . Atrial fibrillation ([place])   . DDD (degenerative disc disease), cervical   . Kidney stone   . Obstructive sleep apnea    mild with AHI 10/hr now on CPAP  . Overweight   . Pneumonia    Past Surgical History:  Procedure Laterality Date  . ANKLE SURGERY Right   . ELECTROPHYSIOLOGIC STUDY N/A 12/25/2014   Procedure: Atrial Fibrillation Ablation;  Surgeon: Thompson Grayer, MD;  Location: [facility];  Service: Cardiovascular;  Laterality: N/A;  . SHOULDER ARTHROSCOPY WITH OPEN ROTATOR CUFF REPAIR Bilateral   . SHOULDER SURGERY    . TEE WITHOUT CARDIOVERSION N/A 12/24/2014   Procedure: TRANSESOPHAGEAL ECHOCARDIOGRAM (TEE);  Surgeon: [name], MD;  Location: [facility];  Service: Cardiovascular;  Laterality: N/A;  . WRIST SURGERY Right     Current Outpatient Medications  Medication Sig Dispense Refill  . diltiazem (CARDIZEM CD) 240 MG 24 hr capsule Take 1 capsule (240 mg total) by  mouth daily. 90 capsule 3  . tiZANidine (ZANAFLEX) 4 MG tablet Take 4 mg by mouth 3 (three) times daily.   0  . traMADol (ULTRAM) 50 MG tablet Take 100 mg by mouth every 6 (six) hours as needed.    . furosemide (LASIX) 40 MG tablet Take 1 tablet (40 mg total) daily as needed by mouth. For fluid. (Patient not taking: Reported on 04/24/2018) 30 tablet 9   No current facility-administered medications for this encounter.     No Known Allergies  Social History   Socioeconomic History  . Marital status: Divorced    Spouse name: Not on file  . Number of children: Not on file  . Years of education: Not on file  . Highest education level: Not on file  Occupational History  . Not on file  Social Needs  . Financial resource strain: Not on file  . Food insecurity:    Worry: Not on file    Inability: Not on file  . Transportation needs:    Medical: Not on file    Non-medical: Not on file  Tobacco Use  . Smoking status: Never Smoker  . Smokeless tobacco: Never Used  Substance and Sexual Activity  . Alcohol use: No  . Drug use: No  . Sexual activity: Not on file  Lifestyle  . Physical activity:    Days per week: Not on file    Minutes per session: Not on file  . Stress:  Not on file  Relationships  . Social connections:    Talks on phone: Not on file    Gets together: Not on file    Attends religious service: Not on file    Active member of club or organization: Not on file    Attends meetings of clubs or organizations: Not on file    Relationship status: Not on file  . Intimate partner violence:    Fear of current or ex partner: Not on file    Emotionally abused: Not on file    Physically abused: Not on file    Forced sexual activity: Not on file  Other Topics Concern  . Not on file  Social History Narrative   Pt lives in [place] with mother.  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 Heart  failure Mother   . Heart attack Mother 43       Widow maker MI  . Cancer Paternal Grandmother   . Stroke Other     ROS- All systems are reviewed and negative except as per the HPI above  Physical Exam: Vitals:   04/24/18 1348  BP: (!) 164/94  Pulse: (!) 107  Weight: (!) 148.3 kg  Height: 6\' 3"  (1.905 m)   Wt Readings from Last 3 Encounters:  04/24/18 (!) 148.3 kg  04/20/17 (!) 158.6 kg  11/25/15 (!) 144.1 kg    Labs: Lab Results  Component Value Date   NA 134 (L) 04/24/2018   K 4.0 04/24/2018   CL 96 (L) 04/24/2018   CO2 25 04/24/2018   GLUCOSE 252 (H) 04/24/2018   BUN 9 04/24/2018   CREATININE 0.79 04/24/2018   CALCIUM 9.4 04/24/2018   Lab Results  Component Value Date   INR 1.5 (H) 01/07/2014   No results found for: CHOL, HDL, LDLCALC, TRIG   GEN- The patient is well appearing, alert and oriented x 3 today.   Head- normocephalic, atraumatic Eyes-  Sclera clear, conjunctiva pink Ears- hearing intact Oropharynx- clear Neck- supple, no JVP Lymph- no cervical lymphadenopathy Lungs- Clear to ausculation bilaterally, normal work of breathing Heart- Regular rate and rhythm, no murmurs, rubs or gallops, PMI not laterally displaced GI- soft, NT, ND, + BS Extremities- no clubbing, cyanosis, or edema MS- no significant deformity or atrophy Skin- no rash or lesion Psych- euthymic mood, full affect Neuro- strength and sensation are intact  EKG-sinus tach at 107 bpm, pr int 148 ms, qrs int 90 ms, qtc 461 ms Epic records reviewed    Assessment and Plan: 1.Palpitations Has noted over the last couple of weeks but I am not sure he has been having afib. He is in s tach today 2 week zio patch to see if can better understand palpitations Update echo   2. Feels poorly and states he can not do his job He is in [place] today but states the still feels poorly I  cannot put him out of work for afib if I do not have documented Afib CBC/CMET/ TSH today to look for other  issues that may be contributing to symptoms  Will see back in 3 weeks   Butch Penny C. Daimen Shovlin, [facility] [address] [place], [place] [address] [phone]

## 2018-04-26 ENCOUNTER — Encounter (HOSPITAL_COMMUNITY): Payer: Self-pay | Admitting: *Deleted

## 2018-04-27 ENCOUNTER — Ambulatory Visit: Payer: Self-pay | Admitting: Internal Medicine

## 2018-05-01 ENCOUNTER — Other Ambulatory Visit: Payer: Self-pay | Admitting: Interventional Cardiology

## 2018-05-03 ENCOUNTER — Telehealth: Payer: Self-pay | Admitting: Interventional Cardiology

## 2018-05-03 NOTE — Telephone Encounter (Signed)
Spoke to [place] from [employer].  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  She verbalized understanding.

## 2018-05-03 NOTE — Telephone Encounter (Signed)
° °  Amanda in HR at [employer] is requesting clarification on return to work letter. HR specifically wants to know does the note include 9/2--> 04/05/18.  Phone : [phone] ext [phone] Fax :[phone]

## 2018-05-04 ENCOUNTER — Ambulatory Visit (HOSPITAL_COMMUNITY)
Admission: RE | Admit: 2018-05-04 | Discharge: 2018-05-04 | Disposition: A | Discharge status: Home / Self Care | Payer: Managed Care, Other (non HMO) | Source: Ambulatory Visit | Attending: Nurse Practitioner | Admitting: Nurse Practitioner

## 2018-05-04 DIAGNOSIS — I517 Cardiomegaly: Secondary | ICD-10-CM | POA: Insufficient documentation

## 2018-05-04 DIAGNOSIS — I509 Heart failure, unspecified: Secondary | ICD-10-CM | POA: Insufficient documentation

## 2018-05-04 DIAGNOSIS — I480 Paroxysmal atrial fibrillation: Secondary | ICD-10-CM | POA: Diagnosis not present

## 2018-05-04 DIAGNOSIS — I4891 Unspecified atrial fibrillation: Secondary | ICD-10-CM | POA: Diagnosis present

## 2018-05-04 MED ORDER — PERFLUTREN LIPID MICROSPHERE
1.0000 mL | INTRAVENOUS | Status: AC | PRN
  Administered 2018-05-04: 2 mL via INTRAVENOUS

## 2018-05-04 NOTE — Progress Notes (Signed)
  Echocardiogram 2D Echocardiogram has been performed.  Jennette Dubin 05/04/2018, 9:56 AM

## 2018-05-08 ENCOUNTER — Ambulatory Visit: Payer: Self-pay | Admitting: Physician Assistant

## 2018-05-08 ENCOUNTER — Encounter

## 2018-05-11 ENCOUNTER — Other Ambulatory Visit: Payer: Self-pay | Admitting: Orthopedic Surgery

## 2018-05-11 DIAGNOSIS — M5416 Radiculopathy, lumbar region: Secondary | ICD-10-CM

## 2018-05-22 ENCOUNTER — Ambulatory Visit (HOSPITAL_COMMUNITY): Payer: Self-pay | Admitting: Nurse Practitioner

## 2018-06-06 ENCOUNTER — Ambulatory Visit: Payer: Self-pay | Admitting: Internal Medicine

## 2018-07-02 ENCOUNTER — Other Ambulatory Visit: Payer: Self-pay

## 2018-07-02 ENCOUNTER — Emergency Department (HOSPITAL_COMMUNITY)
Admission: EM | Admit: 2018-07-02 | Discharge: 2018-07-02 | Disposition: A | Discharge status: Home / Self Care | Payer: Self-pay | Attending: Emergency Medicine | Admitting: Emergency Medicine

## 2018-07-02 ENCOUNTER — Encounter (HOSPITAL_COMMUNITY): Payer: Self-pay

## 2018-07-02 ENCOUNTER — Emergency Department (HOSPITAL_COMMUNITY): Payer: Self-pay

## 2018-07-02 DIAGNOSIS — Y990 Civilian activity done for income or pay: Secondary | ICD-10-CM | POA: Insufficient documentation

## 2018-07-02 DIAGNOSIS — S61451A Open bite of right hand, initial encounter: Secondary | ICD-10-CM | POA: Insufficient documentation

## 2018-07-02 DIAGNOSIS — W540XXA Bitten by dog, initial encounter: Secondary | ICD-10-CM | POA: Insufficient documentation

## 2018-07-02 DIAGNOSIS — Z79899 Other long term (current) drug therapy: Secondary | ICD-10-CM | POA: Insufficient documentation

## 2018-07-02 DIAGNOSIS — Y93K1 Activity, walking an animal: Secondary | ICD-10-CM | POA: Insufficient documentation

## 2018-07-02 DIAGNOSIS — I5032 Chronic diastolic (congestive) heart failure: Secondary | ICD-10-CM | POA: Insufficient documentation

## 2018-07-02 DIAGNOSIS — Y929 Unspecified place or not applicable: Secondary | ICD-10-CM | POA: Insufficient documentation

## 2018-07-02 HISTORY — DX: Other intervertebral disc displacement, lumbar region: M51.26

## 2018-07-02 MED ORDER — AMOXICILLIN-POT CLAVULANATE 875-125 MG PO TABS: 1.0000 | ORAL_TABLET | Freq: Two times a day (BID) | ORAL | 0 refills | Status: DC

## 2018-07-02 NOTE — ED Provider Notes (Signed)
[facility] DEPT Provider Note   CSN: [id number] Arrival date & time: 07/02/18  1536     History   Chief Complaint Chief Complaint  Patient presents with  . Animal Bite    HPI Justin Kidd is a 57 y.o. male.  57 year old male presents with dog bite to right hand.  Patient works as a Development worker, community, he was attempting to put on a leash on a dog to take in for a walk when the dog unexpectedly bit his hand.  Unknown rabies status on the dog, patient states his last tetanus shot was 5 years ago.  States he is ambidextrous, not on blood thinners.  No other injuries.     Past Medical History:  Diagnosis Date  . Anxiety   . Atrial fibrillation ([place])   . DDD (degenerative disc disease), cervical   . Kidney stone   . Lumbar herniated disc   . Obstructive sleep apnea    mild with AHI 10/hr now on CPAP  . Overweight   . Pneumonia     Patient Active Problem List   Diagnosis Date Noted  . A-fib ([place]) 12/25/2014  . Obstructive sleep apnea 12/15/2014  . Daytime somnolence 09/10/2014  . Chronic diastolic heart failure ([place]) 01/07/2014  . Obesity 01/07/2014  . Long term current use of anticoagulant therapy 01/07/2014  . CHF (congestive heart failure) ([place]) 04/11/2012  . Atrial fibrillation ([place]) 04/11/2012    Past Surgical History:  Procedure Laterality Date  . ANKLE SURGERY Right   . ELECTROPHYSIOLOGIC STUDY N/A 12/25/2014   Procedure: Atrial Fibrillation Ablation;  Surgeon: Thompson Grayer, MD;  Location: [place] [facility];  Service: Cardiovascular;  Laterality: N/A;  . MANDIBLE FRACTURE SURGERY    . SHOULDER ARTHROSCOPY WITH OPEN ROTATOR CUFF REPAIR Bilateral   . SHOULDER SURGERY    . TEE WITHOUT CARDIOVERSION N/A 12/24/2014   Procedure: TRANSESOPHAGEAL ECHOCARDIOGRAM (TEE);  Surgeon: [name], MD;  Location: [facility];  Service: Cardiovascular;  Laterality: N/A;  . WRIST SURGERY Right         Home Medications    Prior to Admission  medications   Medication Sig Start Date End Date Taking? Authorizing Provider  amoxicillin-clavulanate (AUGMENTIN) 875-125 MG tablet Take 1 tablet by mouth every 12 (twelve) hours. 07/02/18   Tacy Learn, PA-C  diltiazem (CARDIZEM CD) 240 MG 24 hr capsule Take 1 capsule (240 mg total) by mouth daily. 04/13/18 04/13/19  Sherren Mocha, MD  diltiazem (CARDIZEM) 60 MG tablet TAKE 1 TABLET BY MOUTH AS DIRECTED FOR AFIB 05/01/18   Jettie Booze, MD  furosemide (LASIX) 40 MG tablet Take 1 tablet (40 mg total) daily as needed by mouth. For fluid. Patient not taking: Reported on 04/24/2018 06/14/17   Jettie Booze, MD  tiZANidine (ZANAFLEX) 4 MG tablet Take 4 mg by mouth 3 (three) times daily.  11/16/15   [provider]  traMADol (ULTRAM) 50 MG tablet Take 100 mg by mouth every 6 (six) hours as needed.    [provider]    Family History Family History  Problem Relation Age of Onset  . Heart failure Mother   . Heart attack Mother 83       Widow maker MI  . Cancer Paternal Grandmother   . Stroke Other     Social History Social History   Tobacco Use  . Smoking status: Never Smoker  . Smokeless tobacco: Never Used  Substance Use Topics  . Alcohol use: No  .  Drug use: No     Allergies   Patient has no known allergies.   Review of Systems Review of Systems  Constitutional: Negative for fever.  Musculoskeletal: Positive for myalgias.  Skin: Positive for wound.  Allergic/Immunologic: Negative for immunocompromised state.  Hematological: Does not bruise/bleed easily.  All other systems reviewed and are negative.    Physical Exam Updated Vital Signs BP (!) 147/77 (BP Location: Left Arm)   Pulse (!) 120   Temp 98.3 F (36.8 C) (Oral)   Resp 17   Ht 6\' 3"  (1.905 m)   Wt 136.1 kg   SpO2 95%   BMI 37.50 kg/m   Physical Exam  Constitutional: He is oriented to person, place, and time. He appears well-developed and well-nourished. No distress.    HENT:  Head: Normocephalic and atraumatic.  Cardiovascular: Intact distal pulses.  Pulmonary/Chest: Effort normal.  Musculoskeletal: He exhibits tenderness. He exhibits no deformity.       Hands: Neurological: He is alert and oriented to person, place, and time. No sensory deficit.  Skin: Skin is warm and dry. He is not diaphoretic.  Psychiatric: He has a normal mood and affect. His behavior is normal.  Nursing note and vitals reviewed.    ED Treatments / Results  Labs (all labs ordered are listed, but only abnormal results are displayed) Labs Reviewed - No data to display  EKG None  Radiology Dg Hand Complete Right  Result Date: 07/02/2018 CLINICAL DATA:  Dog bite to the right thumb, initial encounter EXAM: RIGHT HAND - COMPLETE 3+ VIEW COMPARISON:  None. FINDINGS: Soft tissue injury is noted consistent with the recent history. No underlying fracture or dislocation is seen. Mild degenerative interphalangeal changes are noted. IMPRESSION: Soft tissue injury without acute bony abnormality. Electronically Signed   By: Inez Catalina M.D.   On: 07/02/2018 17:03    Procedures .Marland KitchenLaceration Repair Date/Time: 07/02/2018 6:15 PM Performed by: Tacy Learn, PA-C Authorized by: Tacy Learn, PA-C   Consent:    Consent obtained:  Verbal   Consent given by:  Patient   Risks discussed:  Infection, need for additional repair, pain, poor cosmetic result and poor wound healing   Alternatives discussed:  No treatment and delayed treatment Universal protocol:    Procedure explained and questions answered to patient or proxy's satisfaction: yes     Imaging studies available: yes     Patient identity confirmed:  Verbally with patient Anesthesia (see MAR for exact dosages):    Anesthesia method:  None Laceration details:    Location:  Hand   Hand location:  R hand, dorsum   Length (cm):  1   Depth (mm):  3 Repair type:    Repair type:  Simple Pre-procedure details:    Preparation:   Imaging obtained to evaluate for foreign bodies Exploration:    Hemostasis achieved with:  Direct pressure   Wound exploration: wound explored through full range of motion and entire depth of wound probed and visualized     Wound extent: no foreign bodies/material noted, no tendon damage noted and no underlying fracture noted   Treatment:    Area cleansed with:  Betadine   Amount of cleaning:  Standard   Irrigation solution:  Sterile saline Skin repair:    Repair method:  Steri-Strips   Number of Steri-Strips:  1 Approximation:    Approximation:  Loose Post-procedure details:    Dressing:  Bulky dressing   Patient tolerance of procedure:  Tolerated well, no immediate  complications   (including critical care time)  Medications Ordered in ED Medications - No data to display   Initial Impression / Assessment and Plan / ED Course  I have reviewed the triage vital signs and the nursing notes.  Pertinent labs & imaging results that were available during my care of the patient were reviewed by me and considered in my medical decision making (see chart for details).  Clinical Course as of Jul 03 1823  Mon Jul 03, 7771  1830 57 year old right-hand-dominant, ambidextrous male presents with complaint of dog bite to the right hand which occurred today.  Request for animal control notified made for follow-up for rabies vaccine status verification.  Patient's tetanus is up-to-date.  Wounds were thoroughly irrigated, x-ray negative for bony involvement or foreign body.  Larger wound was loosely approximated with one Steri-Strip.  Bleeding controlled with pressure dressing.  Patient given prescription for Augmentin, recommend wound check with PCP in 2 days.   [LM]    Clinical Course User Index [LM] Tacy Learn, PA-C   Final Clinical Impressions(s) / ED Diagnoses   Final diagnoses:  Dog bite of right hand, initial encounter    ED Discharge Orders         Ordered     amoxicillin-clavulanate (AUGMENTIN) 875-125 MG tablet  Every 12 hours     07/02/18 1746           Tacy Learn, PA-C 07/02/18 Etheleen Sia, MD 07/03/18 [id number]

## 2018-07-02 NOTE — Discharge Instructions (Addendum)
Wound check with your PCP in 2 days, return to ER for worsening symptoms. Keep wounds clean, dry, covered. Apply Bacitracin if needed to areas. Take Augmentin as prescribed and complete the full course. Take Motrin and Tylenol as needed as directed for pain.

## 2018-07-02 NOTE — ED Triage Notes (Signed)
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 EMS stated bleeding controlled and wrapped the wound.

## 2018-08-06 ENCOUNTER — Other Ambulatory Visit: Payer: Self-pay | Admitting: Interventional Cardiology

## 2018-08-27 ENCOUNTER — Other Ambulatory Visit: Payer: Self-pay | Admitting: Interventional Cardiology

## 2018-10-29 ENCOUNTER — Other Ambulatory Visit: Payer: Self-pay | Admitting: Interventional Cardiology

## 2018-10-29 MED ORDER — DILTIAZEM HCL 60 MG PO TABS: 60.0000 mg | ORAL_TABLET | Freq: Every day | ORAL | 0 refills | Status: DC

## 2018-10-29 NOTE — Telephone Encounter (Signed)
 *  STAT* If patient is at the pharmacy, call can be transferred to refill team.   1. Which medications need to be refilled? (please list name of each medication and dose if known) diltiazem (CARDIZEM) 60 MG tablet  2. Which pharmacy/location (including street and city if local pharmacy) is medication to be sent to? [facility]  3. Do they need a 30 day or 90 day supply?30  Patient has appt 11/30/18. He is completely out of medication

## 2018-11-23 ENCOUNTER — Telehealth: Payer: Self-pay

## 2018-11-23 NOTE — Telephone Encounter (Signed)

## 2018-11-29 ENCOUNTER — Other Ambulatory Visit: Payer: Self-pay | Admitting: Interventional Cardiology

## 2018-11-29 NOTE — Progress Notes (Signed)
Virtual Visit via Video Note   This visit type was conducted due to national recommendations for restrictions regarding the COVID-19 Pandemic (e.g. social distancing) in an effort to limit this patient's exposure and mitigate transmission in our community.  Due to his co-morbid illnesses, this patient is at least at moderate risk for complications without adequate follow up.  This format is felt to be most appropriate for this patient at this time.  All issues noted in this document were discussed and addressed.  A limited physical exam was performed with this format.  Please refer to the patient's chart for his consent to telehealth for [facility].   Date:  11/30/2018   ID:  Justin Kidd, DOB Dec 17, 1960, MRN [id number]  Patient Location: Home Provider Location: Home  PCP:  Lennie Odor, PA-C  Cardiologist:  No primary care provider on file. [place] Electrophysiologist:  None   Evaluation Performed:  Follow-Up Visit  Chief Complaint:  AFib  History of Present Illness:    Justin Kidd is a 58 y.o. male with a h/o PAF since age 68. He failed AAD (Flecainide) and had an ablationin 2016. This has been successful. He is using CPAP for OSA.  Xarelto was stopped in 2016.  He has had back problems in the past requiring steroids and possibly surgery.  He has declined back surgery.  He has been Tylenol.   He had some fatigue in 9/19.  He had a Zio patch and echo.  He felt he could not do his job at that time.   Zio patch showed: Sinus rhythm No atrial fibrillation Long RP tachycardia is noted 04/29/18 at 10:18 pm and likely represents sinus rhythm No AV block or pauses  Echo in 10/19 showed: Left ventricle: The cavity size was normal. There was moderate  focal basal hypertrophy. Systolic function was vigorous. The  estimated ejection fraction was in the range of 65% to 70%. Wall  motion was normal; there were no regional wall motion  abnormalities. Left ventricular diastolic  function parameters  were normal.  He is walking regularly with the shelter in place.    He has a mask and is social distancing.  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  Denies : Chest pain. Dizziness. Leg edema. Nitroglycerin use. Orthopnea. Palpitations. Paroxysmal nocturnal dyspnea. Shortness of breath. Syncope.    The patient does not have symptoms concerning for COVID-19 infection (fever, chills, cough, or new shortness of breath).    Past Medical History:  Diagnosis Date  . Anxiety   . Atrial fibrillation ([place])   . DDD (degenerative disc disease), cervical   . Kidney stone   . Lumbar herniated disc   . Obstructive sleep apnea    mild with AHI 10/hr now on CPAP  . Overweight   . Pneumonia    Past Surgical History:  Procedure Laterality Date  . ANKLE SURGERY Right   . ELECTROPHYSIOLOGIC STUDY N/A 12/25/2014   Procedure: Atrial Fibrillation Ablation;  Surgeon: Thompson Grayer, MD;  Location: [facility];  Service: Cardiovascular;  Laterality: N/A;  . MANDIBLE FRACTURE SURGERY    . SHOULDER ARTHROSCOPY WITH OPEN ROTATOR CUFF REPAIR Bilateral   . SHOULDER SURGERY    . TEE WITHOUT CARDIOVERSION N/A 12/24/2014   Procedure: TRANSESOPHAGEAL ECHOCARDIOGRAM (TEE);  Surgeon: [name], MD;  Location: [facility];  Service: Cardiovascular;  Laterality: N/A;  . WRIST SURGERY Right  Current Meds  Medication Sig  . diltiazem (CARDIZEM CD) 240 MG 24 hr capsule Take 1 capsule (240 mg total) by mouth daily.  Marland Kitchen diltiazem (CARDIZEM) 60 MG tablet Take 1 tablet (60 mg total) by mouth daily.  . furosemide (LASIX) 40 MG tablet TAKE ONE TABLET BY MOUTH DAILY AS NEEDED FOR FLUID  . tiZANidine (ZANAFLEX) 4 MG tablet Take 4 mg by mouth 3 (three) times daily.      Allergies:   Patient has no known allergies.   Social History   Tobacco Use  . Smoking status: Never Smoker  . Smokeless tobacco:  Never Used  Substance Use Topics  . Alcohol use: No  . Drug use: No     Family Hx: The patient's family history includes Cancer in his paternal grandmother; Heart attack (age of onset: 29) in his mother; Heart failure in his mother; Stroke in an other family member.  ROS:   Please see the history of present illness.    Anxiety recently, he has had panic attacks in the past All other systems reviewed and are negative.   Prior CV studies:   The following studies were reviewed today:   As above  Labs/Other Tests and Data Reviewed:    EKG:  An ECG dated 9/19 was personally reviewed today and demonstrated:  NSR, IRBBB, no ST changes  Recent Labs: 04/24/2018: ALT 17; BUN 9; Creatinine, Ser 0.79; Hemoglobin 15.0; Platelets 294; Potassium 4.0; Sodium 134; TSH 0.541   Recent Lipid Panel No results found for: CHOL, TRIG, HDL, CHOLHDL, LDLCALC, LDLDIRECT  Wt Readings from Last 3 Encounters:  11/30/18 278 lb (126.1 kg)  07/02/18 300 lb (136.1 kg)  04/24/18 (!) 327 lb (148.3 kg)     Objective:    Vital Signs:  BP 126/78   Pulse 84   Ht 6\' 3"  (1.905 m)   Wt 278 lb (126.1 kg)   BMI 34.75 kg/m    VITAL SIGNS:  reviewed GEN:  no acute distress RESPIRATORY:  normal respiratory effort, symmetric expansion PSYCH:  normal affect exam limited by video format  Bandage on the right side of face  ASSESSMENT & PLAN:    1. AFib: Maintaining NSR.  Xarelto was stopped in 2016.  [place]ill check with Dr. Rayann Heman if aspirin is indicated.   2. Obesity: Doing very well losing weight.  He has changed his diet.  He stopped drinking sodas.   COntinue trying to lose weight. 3. Chronic diastolic heart failure: Continue Lasix.  Elevate legs.  Occasional feet swelling.   4. OSA: CPAP.  Using this regularly.   5. Elevated blood sugar.  Will need to recheck A1C when virus concerns reduce  COVID-19 Education: The signs and symptoms of COVID-19 were discussed with the patient and how to seek care for  testing (follow up with PCP or arrange E-visit).  The importance of social distancing was discussed today.  Time:   Today, I have spent 25 minutes with the patient with telehealth technology discussing the above problems.     Medication Adjustments/Labs and Tests Ordered: Current medicines are reviewed at length with the patient today.  Concerns regarding medicines are outlined above.   Tests Ordered: No orders of the defined types were placed in this encounter.   Medication Changes: No orders of the defined types were placed in this encounter.   Disposition:  Follow up in 1 year(s)  Signed, Larae Grooms, MD  11/30/2018 8:29 AM    [facility]

## 2018-11-30 ENCOUNTER — Telehealth (INDEPENDENT_AMBULATORY_CARE_PROVIDER_SITE_OTHER): Payer: Self-pay | Admitting: Interventional Cardiology

## 2018-11-30 ENCOUNTER — Other Ambulatory Visit: Payer: Self-pay

## 2018-11-30 ENCOUNTER — Encounter: Payer: Self-pay | Admitting: Interventional Cardiology

## 2018-11-30 VITALS — BP 126/78 | HR 84 | Ht 75.0 in | Wt 278.0 lb

## 2018-11-30 DIAGNOSIS — Z6834 Body mass index (BMI) 34.0-34.9, adult: Secondary | ICD-10-CM

## 2018-11-30 DIAGNOSIS — E669 Obesity, unspecified: Secondary | ICD-10-CM

## 2018-11-30 DIAGNOSIS — G4733 Obstructive sleep apnea (adult) (pediatric): Secondary | ICD-10-CM

## 2018-11-30 DIAGNOSIS — I5032 Chronic diastolic (congestive) heart failure: Secondary | ICD-10-CM

## 2018-11-30 DIAGNOSIS — I480 Paroxysmal atrial fibrillation: Secondary | ICD-10-CM

## 2018-11-30 NOTE — Patient Instructions (Addendum)
Medication Instructions:  Your physician recommends that you continue on your current medications as directed. Please refer to the Current Medication list given to you today.  If you need a refill on your cardiac medications before your next appointment, please call your pharmacy.   Lab work: Your physician recommends that you return for lab work (CMET, A1C) on 04/04/19   If you have labs (blood work) drawn today and your tests are completely normal, you will receive your results only by: Marland Kitchen MyChart Message (if you have MyChart) OR . A paper copy in the mail If you have any lab test that is abnormal or we need to change your treatment, we will call you to review the results.  Testing/Procedures: None ordered  Follow-Up: At [facility], you and your health needs are our priority.  As part of our continuing mission to provide you with exceptional heart care, we have created designated Provider Care Teams.  These Care Teams include your primary Cardiologist (physician) and Advanced Practice Providers (APPs -  Physician Assistants and Nurse Practitioners) who all work together to provide you with the care you need, when you need it. . You will need a follow up appointment in 1 year.  Please call our office 2 months in advance to schedule this appointment.  You may see Casandra Doffing, MD or one of the following Advanced Practice Providers on your designated Care Team:   . Lyda Jester, PA-C . Dayna Dunn, PA-C . Ermalinda Barrios, PA-C  Any Other Special Instructions Will Be Listed Below (If Applicable).

## 2019-02-05 ENCOUNTER — Encounter: Payer: Self-pay | Admitting: Interventional Cardiology

## 2019-02-05 ENCOUNTER — Other Ambulatory Visit: Payer: Self-pay | Admitting: Interventional Cardiology

## 2019-02-25 ENCOUNTER — Other Ambulatory Visit: Payer: Self-pay | Admitting: Interventional Cardiology

## 2019-04-04 ENCOUNTER — Other Ambulatory Visit: Payer: Self-pay

## 2019-04-16 ENCOUNTER — Ambulatory Visit: Payer: Self-pay

## 2019-04-23 ENCOUNTER — Ambulatory Visit: Payer: Self-pay

## 2019-04-30 ENCOUNTER — Ambulatory Visit: Payer: Self-pay

## 2019-05-07 ENCOUNTER — Encounter: Payer: Self-pay | Attending: Physician Assistant | Admitting: Dietician

## 2019-05-07 ENCOUNTER — Encounter: Payer: Self-pay | Admitting: Dietician

## 2019-05-07 ENCOUNTER — Other Ambulatory Visit: Payer: Self-pay

## 2019-05-07 DIAGNOSIS — E119 Type 2 diabetes mellitus without complications: Secondary | ICD-10-CM | POA: Insufficient documentation

## 2019-05-07 DIAGNOSIS — E1169 Type 2 diabetes mellitus with other specified complication: Secondary | ICD-10-CM | POA: Insufficient documentation

## 2019-05-07 NOTE — Progress Notes (Signed)
Patient was seen on 05/07/2019 for the first of a series of three diabetes self-management courses at the [facility].  Patient Education Plan per assessed needs and concerns is to attend three course education program for Diabetes Self Management Education.  The following learning objectives were met by the patient during this class:  Describe diabetes, types of diabetes and pathophysiology  State some common risk factors for diabetes  Defines the role of glucose and insulin  Describe the relationship between diabetes and cardiovascular and other risks  State the members of the Healthcare Team  States the rationale for glucose monitoring and when to test  State their individual [place] the importance of logging glucose readings and how to interpret the readings  Identifies A1C target  Explain the correlation between A1c and eAG values  State symptoms and treatment of high blood glucose and low blood glucose  Explain proper technique for glucose testing and identify proper sharps disposal  Handouts given during class include:  How to Thrive:  A Guide for Your Journey with Diabetes by the ADA  Meal Plan Card and carbohydrate content list  Dietary intake form  Low Sodium Flavoring Tips  Types of Fats  Dining Out  Label reading  Snack list  Planning a balanced meal  The diabetes portion plate  Diabetes Resources  A1c to eAG Conversion Chart  Blood Glucose Log  Diabetes Recommended Care Schedule  Support Group  Diabetes Success Plan  Core Class Satisfaction Survey   Follow-Up Plan:  Attend core 2

## 2019-05-14 ENCOUNTER — Encounter: Payer: Self-pay | Admitting: Dietician

## 2019-05-14 ENCOUNTER — Other Ambulatory Visit: Payer: Self-pay

## 2019-05-14 DIAGNOSIS — E119 Type 2 diabetes mellitus without complications: Secondary | ICD-10-CM

## 2019-05-14 NOTE — Progress Notes (Signed)
Patient was seen on 05/14/2019 for the second of a series of three diabetes self-management courses at the [facility]. The following learning objectives were met by the patient during this class:   Describe the role of different macronutrients on glucose  Explain how carbohydrates affect blood glucose  State what foods contain the most carbohydrates  Demonstrate carbohydrate counting  Demonstrate how to read Nutrition Facts food label  Describe effects of various fats on heart health  Describe the importance of good nutrition for health and healthy eating strategies  Describe techniques for managing your shopping, cooking and meal planning  List strategies to follow meal plan when dining out  Describe the effects of alcohol on glucose and how to use it safely  Goals:  Follow Diabetes Meal Plan as instructed  Aim to spread carbs evenly throughout the day  Aim for 3 meals per day and snacks as needed Include lean protein foods to meals/snacks  Monitor glucose levels as instructed by your doctor   Follow-Up Plan:  Attend Core 3  Work towards following your personal food plan.

## 2019-05-21 ENCOUNTER — Other Ambulatory Visit: Payer: Self-pay

## 2019-05-21 ENCOUNTER — Encounter: Payer: Self-pay | Admitting: Dietician

## 2019-05-21 DIAGNOSIS — E119 Type 2 diabetes mellitus without complications: Secondary | ICD-10-CM

## 2019-05-21 NOTE — Progress Notes (Signed)
Patient was seen on 05/21/19 for the third of a series of three diabetes self-management courses at the [facility].   Justin Kidd the amount of activity recommended for healthy living . Describe activities suitable for individual needs . Identify ways to regularly incorporate activity into daily life . Identify barriers to activity and ways to over come these barriers  Identify diabetes medications being personally used and their primary action for lowering glucose and possible side effects . Describe role of stress on blood glucose and develop strategies to address psychosocial issues . Identify diabetes complications and ways to prevent them  Explain how to manage diabetes during illness . Evaluate success in meeting personal goal . Establish 2-3 goals that they will plan to diligently work on  Goals:   I will count my carb choices at most meals and snacks  I will be active 30 minutes or more 7 times a week  I will take my diabetes medications as scheduled  I will eat less unhealthy fats by eating less sugary things  I will look at patterns in my record book at least 2 days a month  To help manage stress I will  exercise at least 5 times a week  Your patient has identified these potential barriers to change:  Lack of Family Support  Your patient has identified their diabetes self-care support plan as  On-line Resources  [facility] Support Group   American Diabetes Association Website    Plan:  Attend Support Group as desired

## 2019-06-04 ENCOUNTER — Other Ambulatory Visit: Payer: Self-pay | Admitting: Interventional Cardiology

## 2019-06-20 ENCOUNTER — Encounter: Payer: Self-pay | Admitting: Plastic Surgery

## 2019-06-20 ENCOUNTER — Ambulatory Visit (INDEPENDENT_AMBULATORY_CARE_PROVIDER_SITE_OTHER): Payer: Self-pay | Admitting: Plastic Surgery

## 2019-06-20 ENCOUNTER — Other Ambulatory Visit: Payer: Self-pay

## 2019-06-20 VITALS — BP 162/81 | HR 87 | Temp 98.4°F | Ht 73.0 in | Wt 341.4 lb

## 2019-06-20 DIAGNOSIS — C44319 Basal cell carcinoma of skin of other parts of face: Secondary | ICD-10-CM

## 2019-06-20 NOTE — Progress Notes (Signed)
Referring Provider Redmon, Noelle, PA-C [address] [place],  [place] [address]   CC:  Chief Complaint  Patient presents with  . Advice Only    for MOHS repair on 11/24      Justin Kidd is an 58 y.o. male.  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  This is going to be excised with Mohs.  He has been there for about a year and is getting bigger and starting to ooze a little bit.  He does not have health insurance and wants this to be covered in the office if possible.  His Mohs surgery is scheduled for about a week from now.  He is interested in what the potential reconstructive options will be.  He says his hemoglobin A1c is around 7 at this point.  No Known Allergies  Outpatient Encounter Medications as of 06/20/2019  Medication Sig  . atorvastatin (LIPITOR) 10 MG tablet Take 1 tablet by mouth daily.  . busPIRone (BUSPAR) 10 MG tablet Take 1 tablet by mouth 3 (three) times daily.  Marland Kitchen diltiazem (CARDIZEM CD) 240 MG 24 hr capsule Take 1 capsule (240 mg total) by mouth daily.  Marland Kitchen diltiazem (CARDIZEM) 60 MG tablet TAKE ONE TABLET BY MOUTH DAILY  . escitalopram (LEXAPRO) 20 MG tablet Take 1 tablet by mouth daily.  . furosemide (LASIX) 40 MG tablet TAKE ONE TABLET BY MOUTH DAILY AS NEEDED FOR FLUID  . metFORMIN (GLUCOPHAGE) 1000 MG tablet Take 1 tablet by mouth 2 (two) times daily.  Marland Kitchen tiZANidine (ZANAFLEX) 4 MG tablet Take 4 mg by mouth 3 (three) times daily.    No facility-administered encounter medications on file as of 06/20/2019.      Past Medical History:  Diagnosis Date  . Anxiety   . Atrial fibrillation ([place])   . DDD (degenerative disc disease), cervical   . Diabetes mellitus without complication ([place])   . Hyperlipidemia   . Kidney stone   . Lumbar herniated disc   . Obstructive sleep apnea    mild with AHI 10/hr now on CPAP  . Overweight   . Pneumonia   . Sleep apnea     Past Surgical History:  Procedure  Laterality Date  . ANKLE SURGERY Right   . ELECTROPHYSIOLOGIC STUDY N/A 12/25/2014   Procedure: Atrial Fibrillation Ablation;  Surgeon: Thompson Grayer, MD;  Location: [facility];  Service: Cardiovascular;  Laterality: N/A;  . MANDIBLE FRACTURE SURGERY    . SHOULDER ARTHROSCOPY WITH OPEN ROTATOR CUFF REPAIR Bilateral   . SHOULDER SURGERY    . TEE WITHOUT CARDIOVERSION N/A 12/24/2014   Procedure: TRANSESOPHAGEAL ECHOCARDIOGRAM (TEE);  Surgeon: [name], MD;  Location: [facility] ENDOSCOPY;  Service: Cardiovascular;  Laterality: N/A;  . WRIST SURGERY Right     Family History  Problem Relation Age of Onset  . Heart failure Mother   . Heart attack Mother 80       Widow maker MI  . Cancer Paternal Grandmother   . Stroke Other     Social History   Social History Narrative   Pt lives in [place] with mother.  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  Cranial nerves grossly intact.  Sensation is intact throughout.  He has around a 4 x 4 centimeter mass in the right cheek lateral to the lateral canthus.  There is other small areas superiorly and laterally that look like they were biopsied as well.  He says those will be treated with cream for now.  There is no obvious scars around the mass on his cheek.  Assessment/Plan Patient is here for preoperative consultation regarding what is expected to be a large right cheek defect after Mohs excision of basal cell carcinoma.  I discussed the likelihood for the need of a  skin graft or rotational flap depending on the size of the final defect.  I told him I do my best to find a solution for this problem that could be handled in the office as that seems to be his main priority.  I answered all of his questions and I will plan on determining the best reconstructive option when he sees me in the office after his excision.  Cindra Presume 06/20/2019, 3:20 PM

## 2019-06-26 ENCOUNTER — Ambulatory Visit: Payer: Self-pay | Admitting: Plastic Surgery

## 2019-07-01 ENCOUNTER — Ambulatory Visit (INDEPENDENT_AMBULATORY_CARE_PROVIDER_SITE_OTHER): Payer: Self-pay | Admitting: Plastic Surgery

## 2019-07-01 ENCOUNTER — Other Ambulatory Visit: Payer: Self-pay

## 2019-07-01 VITALS — BP 126/84 | HR 72 | Temp 98.2°F | Ht 73.0 in | Wt 327.0 lb

## 2019-07-01 DIAGNOSIS — C44319 Basal cell carcinoma of skin of other parts of face: Secondary | ICD-10-CM

## 2019-07-01 NOTE — Progress Notes (Signed)
Patient is here after his Mohs excision of a right cheek basal cell carcinoma.  This is left a fairly significant defect on the right cheek at about 6 cm in diameter.  The depths of the wound is also significant with the estimated depth being about 1 cm.  The wound bed looks healthy and his cranial nerves all appear to be intact.  I had a long discussion with Justin Kidd about his reconstructive options.  Ultimately I think the best solution would be a large rotational flap taking the pre and postauricular skin and rotating that to cover the defect.  The extent of this procedure however would require anesthesia due to the extent of undermining.  At the moment this would be cost prohibitive for him as he is without insurance at this time.  We did discuss full-thickness skin graft.  This could be done in the office which would make it significantly cheaper for him.  I do believe there would be benefit to letting the wound granulate as the depth of the wound would be a bit less requiring a smaller skin graft and giving a better overall contour.  He did comment that he should have insurance again starting January 1 and he might be able to have the flap procedure done at that time.  For the moment we will get a do wound care with wet-to-dry dressings.  I will plan to see him again in 2 weeks and will investigate the various options.

## 2019-07-03 NOTE — Progress Notes (Signed)
Yes. Once he has secured insurance, you will be able to proceed as needed.

## 2019-07-18 ENCOUNTER — Ambulatory Visit: Payer: Self-pay | Admitting: Plastic Surgery

## 2019-09-02 ENCOUNTER — Other Ambulatory Visit: Payer: Self-pay | Admitting: Cardiovascular Disease

## 2019-09-03 MED ORDER — DILTIAZEM HCL ER COATED BEADS 240 MG PO CP24: 240.0000 mg | ORAL_CAPSULE | Freq: Every day | ORAL | 0 refills | Status: DC

## 2019-10-11 ENCOUNTER — Telehealth: Payer: Self-pay | Admitting: Interventional Cardiology

## 2019-10-11 NOTE — Telephone Encounter (Signed)
   Shamekia from [facility] calling requesting to get copy of pt's medication list and last visit to Dr. Irish Lack for their coordination of care. She request to fax to them at [phone]  Please advise

## 2019-10-14 NOTE — Telephone Encounter (Signed)
Spoke with Justin Kidd in Medical Records and she states that she faxed this information to the [facility] on Friday.

## 2019-11-22 ENCOUNTER — Other Ambulatory Visit: Payer: Self-pay | Admitting: Interventional Cardiology

## 2019-11-27 ENCOUNTER — Other Ambulatory Visit: Payer: Self-pay | Admitting: Cardiovascular Disease

## 2019-11-28 MED ORDER — DILTIAZEM HCL ER COATED BEADS 240 MG PO CP24: 240.0000 mg | ORAL_CAPSULE | Freq: Every day | ORAL | 0 refills | Status: DC

## 2019-12-08 NOTE — Progress Notes (Signed)
Cardiology Office Note   Date:  12/09/2019   ID:  JKAI ELFMAN, DOB 04/26/1961, MRN [id number]  PCP:  Lennie Odor, PA    No chief complaint on file.  Atrial fibrillation  Wt Readings from Last 3 Encounters:  12/09/19 (!) 319 lb 9.6 oz (145 kg)  07/01/19 (!) 327 lb (148.3 kg)  06/20/19 (!) 341 lb 6.4 oz (154.9 kg)       History of Present Illness: Justin Kidd is a 59 y.o. male  with a h/o PAF since age 22. He failed AAD (Flecainide) and had an ablationin 2016. This has been successful. He is using CPAP for OSA.  Xarelto was stopped in 2016.  He has had back problems in the past requiring steroids andpossiblysurgery. He has declined back surgery.   He had some fatigue in 9/19.  He had a Zio patch and echo.  He felt he could not do his job at that time.   Zio patch showed: Sinus rhythm No atrial fibrillation Long RP tachycardia is noted 04/29/18 at 10:18 pm and likely represents sinus rhythm No AV block or pauses  Echo in 10/19 showed: Left ventricle: The cavity size was normal. There was moderate  focal basal hypertrophy. Systolic function was vigorous. The  estimated ejection fraction was in the range of 65% to 70%. Wall  motion was normal; there were no regional wall motion  abnormalities. Left ventricular diastolic function parameters  were normal.  He lost weight in early 2020 with lifestyle changes including stopping soda consumption.  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    Since the last visit, he has been walking and exercising.  He has lost weight.  He avoids fast foods.      Past Medical History:  Diagnosis Date  . Anxiety   . Atrial fibrillation ([place])   . DDD (degenerative disc disease), cervical   . Diabetes mellitus without complication ([place])   . Hyperlipidemia   . Kidney stone   . Lumbar herniated disc   . Obstructive sleep apnea    mild with AHI 10/hr now on CPAP  .  Overweight   . Pneumonia   . Sleep apnea     Past Surgical History:  Procedure Laterality Date  . ANKLE SURGERY Right   . ELECTROPHYSIOLOGIC STUDY N/A 12/25/2014   Procedure: Atrial Fibrillation Ablation;  Surgeon: Thompson Grayer, MD;  Location: [place] [facility];  Service: Cardiovascular;  Laterality: N/A;  . MANDIBLE FRACTURE SURGERY    . SHOULDER ARTHROSCOPY WITH OPEN ROTATOR CUFF REPAIR Bilateral   . SHOULDER SURGERY    . TEE WITHOUT CARDIOVERSION N/A 12/24/2014   Procedure: TRANSESOPHAGEAL ECHOCARDIOGRAM (TEE);  Surgeon: [name], MD;  Location: [facility];  Service: Cardiovascular;  Laterality: N/A;  . WRIST SURGERY Right      Current Outpatient Medications  Medication Sig Dispense Refill  . atorvastatin (LIPITOR) 10 MG tablet Take 1 tablet by mouth daily.    . busPIRone (BUSPAR) 10 MG tablet Take 1 tablet by mouth 3 (three) times daily.    Marland Kitchen diltiazem (CARDIZEM CD) 240 MG 24 hr capsule Take 1 capsule (240 mg total) by mouth daily. Please keep upcoming appt in May with Dr. Irish Lack for future refills. Thank you 90 capsule 0  . diltiazem (CARDIZEM) 60 MG tablet TAKE ONE TABLET BY MOUTH DAILY 90 tablet 0  . escitalopram (LEXAPRO) 20 MG tablet Take 1 tablet by mouth  daily.    . furosemide (LASIX) 40 MG tablet TAKE ONE TABLET BY MOUTH DAILY AS NEEDED FOR FLUID 90 tablet 2  . metFORMIN (GLUCOPHAGE) 1000 MG tablet Take 1 tablet by mouth 2 (two) times daily.    Marland Kitchen tiZANidine (ZANAFLEX) 4 MG tablet Take 4 mg by mouth 3 (three) times daily.   0   No current facility-administered medications for this visit.    Allergies:   Patient has no known allergies.    Social History:  The patient  reports that he has never smoked. He has never used smokeless tobacco. He reports that he does not drink alcohol or use drugs.   Family History:  The patient's family history includes Cancer in his paternal grandmother; Heart attack (age of onset: 52) in his mother; Heart failure in his  mother; Stroke in an other family member.    ROS:  Please see the history of present illness.   Otherwise, review of systems are positive for intentional weight loss. Skin cancer removed from his right cheek in Dec 2020.   All other systems are reviewed and negative.    PHYSICAL EXAM: VS:  BP 102/62   Pulse 95   Ht 6\' 1"  (1.854 m)   Wt (!) 319 lb 9.6 oz (145 kg)   SpO2 (!) 71%   BMI 42.17 kg/m  , BMI Body mass index is 42.17 kg/m. GEN: Well nourished, well developed, in no acute distress  HEENT: normal ; right face scar Neck: no JVD, carotid bruits, or masses Cardiac: RRR; no murmurs, rubs, or gallops,no edema  Respiratory:  clear to auscultation bilaterally, normal work of breathing GI: soft, nontender, nondistended, + BS MS: no deformity or atrophy  Skin: warm and dry, no rash Neuro:  Strength and sensation are intact Psych: euthymic mood, full affect   EKG:   The ekg ordered today demonstrates NSR, no ST changes   Recent Labs: No results found for requested labs within last 8760 hours.   Lipid Panel No results found for: CHOL, TRIG, HDL, CHOLHDL, VLDL, LDLCALC, LDLDIRECT   Other studies Reviewed: Additional studies/ records that were reviewed today with results demonstrating: PMD labs reviewed .   ASSESSMENT AND PLAN:  1. Atrial fibrillation: No palpitations.  Off of Xarelto but taking ASA 81 mg daily.  2. Obesity: He has been losing weight through early 2020 with lifestyle changes including stopping soda consumption. 3. Chronic diastolic heart failure: Appears euvolemic.  4. OSA: Using CPAP.   5. Elevated blood sugar:  A1C 7.7.  Was over 12 in the past.  Whole food, plant-based diet recommended.   Current medicines are reviewed at length with the patient today.  The patient concerns regarding his medicines were addressed.  The following changes have been made:  No change  Labs/ tests ordered today include:  No orders of the defined types were placed in this  encounter.   Recommend 150 minutes/week of aerobic exercise Low fat, low carb, high fiber diet recommended  Disposition:   FU in 1 year   Signed, Larae Grooms, MD  12/09/2019 8:50 AM    [facility] [place], [place], [place]  [address] Phone: [phone]; Fax: [phone]

## 2019-12-09 ENCOUNTER — Ambulatory Visit (INDEPENDENT_AMBULATORY_CARE_PROVIDER_SITE_OTHER): Payer: 59 | Admitting: Interventional Cardiology

## 2019-12-09 ENCOUNTER — Encounter: Payer: Self-pay | Admitting: Interventional Cardiology

## 2019-12-09 ENCOUNTER — Other Ambulatory Visit: Payer: Self-pay

## 2019-12-09 VITALS — BP 102/62 | HR 95 | Ht 73.0 in | Wt 319.6 lb

## 2019-12-09 DIAGNOSIS — I480 Paroxysmal atrial fibrillation: Secondary | ICD-10-CM | POA: Diagnosis not present

## 2019-12-09 DIAGNOSIS — E669 Obesity, unspecified: Secondary | ICD-10-CM | POA: Diagnosis not present

## 2019-12-09 DIAGNOSIS — Z6834 Body mass index (BMI) 34.0-34.9, adult: Secondary | ICD-10-CM

## 2019-12-09 DIAGNOSIS — I5032 Chronic diastolic (congestive) heart failure: Secondary | ICD-10-CM | POA: Diagnosis not present

## 2019-12-09 DIAGNOSIS — R739 Hyperglycemia, unspecified: Secondary | ICD-10-CM

## 2019-12-09 DIAGNOSIS — G4733 Obstructive sleep apnea (adult) (pediatric): Secondary | ICD-10-CM

## 2019-12-09 NOTE — Patient Instructions (Signed)
Medication Instructions:  Your physician recommends that you continue on your current medications as directed. Please refer to the Current Medication list given to you today.  *If you need a refill on your cardiac medications before your next appointment, please call your pharmacy*   Lab Work: None ordered  If you have labs (blood work) drawn today and your tests are completely normal, you will receive your results only by: . MyChart Message (if you have MyChart) OR . A paper copy in the mail If you have any lab test that is abnormal or we need to change your treatment, we will call you to review the results.   Testing/Procedures: None ordered   Follow-Up: At [facility], you and your health needs are our priority.  As part of our continuing mission to provide you with exceptional heart care, we have created designated Provider Care Teams.  These Care Teams include your primary Cardiologist (physician) and Advanced Practice Providers (APPs -  Physician Assistants and Nurse Practitioners) who all work together to provide you with the care you need, when you need it.  We recommend signing up for the patient portal called "MyChart".  Sign up information is provided on this After Visit Summary.  MyChart is used to connect with patients for Virtual Visits (Telemedicine).  Patients are able to view lab/test results, encounter notes, upcoming appointments, etc.  Non-urgent messages can be sent to your provider as well.   To learn more about what you can do with MyChart, go to [email or web address].    Your next appointment:   12 month(s)  The format for your next appointment:   In Person  Provider:   You may see Jayadeep Varanasi, MD or one of the following Advanced Practice Providers on your designated Care Team:    Dayna Dunn, PA-C  Michele Lenze, PA-C    Other Instructions  High-Fiber Diet Fiber, also called dietary fiber, is a type of carbohydrate that is found in fruits,  vegetables, whole grains, and beans. A high-fiber diet can have many health benefits. Your health care provider may recommend a high-fiber diet to help:  Prevent constipation. Fiber can make your bowel movements more regular.  Lower your cholesterol.  Relieve the following conditions: ? Swelling of veins in the anus (hemorrhoids). ? Swelling and irritation (inflammation) of specific areas of the digestive tract (uncomplicated diverticulosis). ? A problem of the large intestine (colon) that sometimes causes pain and diarrhea (irritable bowel syndrome, IBS).  Prevent overeating as part of a weight-loss plan.  Prevent heart disease, type 2 diabetes, and certain cancers. What is my plan? The recommended daily fiber intake in grams (g) includes:  38 g for men age 50 or younger.  30 g for men over age 50.  25 g for women age 50 or younger.  21 g for women over age 50. You can get the recommended daily intake of dietary fiber by:  Eating a variety of fruits, vegetables, grains, and beans.  Taking a fiber supplement, if it is not possible to get enough fiber through your diet. What do I need to know about a high-fiber diet?  It is better to get fiber through food sources rather than from fiber supplements. There is not a lot of research about how effective supplements are.  Always check the fiber content on the nutrition facts label of any prepackaged food. Look for foods that contain 5 g of fiber or more per serving.  Talk with a diet and nutrition specialist (  dietitian) if you have questions about specific foods that are recommended or not recommended for your medical condition, especially if those foods are not listed below.  Gradually increase how much fiber you consume. If you increase your intake of dietary fiber too quickly, you may have bloating, cramping, or gas.  Drink plenty of water. Water helps you to digest fiber. What are tips for following this plan?  Eat a wide  variety of high-fiber foods.  Make sure that half of the grains that you eat each day are whole grains.  Eat breads and cereals that are made with whole-grain flour instead of refined flour or white flour.  Eat brown rice, bulgur wheat, or millet instead of white rice.  Start the day with a breakfast that is high in fiber, such as a cereal that contains 5 g of fiber or more per serving.  Use beans in place of meat in soups, salads, and pasta dishes.  Eat high-fiber snacks, such as berries, raw vegetables, nuts, and popcorn.  Choose whole fruits and vegetables instead of processed forms like juice or sauce. What foods can I eat?  Fruits Berries. Pears. Apples. Oranges. Avocado. Prunes and raisins. Dried figs. Vegetables Sweet potatoes. Spinach. Kale. Artichokes. Cabbage. Broccoli. Cauliflower. Green peas. Carrots. Squash. Grains Whole-grain breads. Multigrain cereal. Oats and oatmeal. Brown rice. Barley. Bulgur wheat. Millet. Quinoa. Bran muffins. Popcorn. Rye wafer crackers. Meats and other proteins Navy, kidney, and pinto beans. Soybeans. Split peas. Lentils. Nuts and seeds. Dairy Fiber-fortified yogurt. Beverages Fiber-fortified soy milk. Fiber-fortified orange juice. Other foods Fiber bars. The items listed above may not be a complete list of recommended foods and beverages. Contact a dietitian for more options. What foods are not recommended? Fruits Fruit juice. Cooked, strained fruit. Vegetables Fried potatoes. Canned vegetables. Well-cooked vegetables. Grains White bread. Pasta made with refined flour. White rice. Meats and other proteins Fatty cuts of meat. Fried chicken or fried fish. Dairy Milk. Yogurt. Cream cheese. Sour cream. Fats and oils Butters. Beverages Soft drinks. Other foods Cakes and pastries. The items listed above may not be a complete list of foods and beverages to avoid. Contact a dietitian for more information. Summary  Fiber is a type of  carbohydrate. It is found in fruits, vegetables, whole grains, and beans.  There are many health benefits of eating a high-fiber diet, such as preventing constipation, lowering blood cholesterol, helping with weight loss, and reducing your risk of heart disease, diabetes, and certain cancers.  Gradually increase your intake of fiber. Increasing too fast can result in cramping, bloating, and gas. Drink plenty of water while you increase your fiber.  The best sources of fiber include whole fruits and vegetables, whole grains, nuts, seeds, and beans. This information is not intended to replace advice given to you by your health care provider. Make sure you discuss any questions you have with your health care provider. Document Revised: 05/22/2017 Document Reviewed: 05/22/2017 Elsevier Patient Education  2020 Elsevier Inc.   

## 2020-01-25 ENCOUNTER — Other Ambulatory Visit: Payer: Self-pay | Admitting: Interventional Cardiology

## 2020-02-24 ENCOUNTER — Other Ambulatory Visit: Payer: Self-pay | Admitting: Interventional Cardiology

## 2020-05-05 ENCOUNTER — Other Ambulatory Visit: Payer: Self-pay | Admitting: Interventional Cardiology

## 2020-05-10 ENCOUNTER — Other Ambulatory Visit: Payer: Self-pay | Admitting: Interventional Cardiology

## 2020-05-12 NOTE — Telephone Encounter (Signed)
Outpatient Medication Detail   Disp Refills Start End   furosemide (LASIX) 40 MG tablet 90 tablet 2 05/06/2020    Sig: TAKE ONE TABLET BY MOUTH DAILY AS NEEDED FOR FLUID   Sent to pharmacy as: furosemide (LASIX) 40 MG tablet   E-Prescribing Status: Receipt confirmed by pharmacy (05/06/2020  3:44 PM EDT)   Pharmacy  [id number] [place], [address]

## 2021-03-10 ENCOUNTER — Telehealth: Payer: Self-pay | Admitting: Interventional Cardiology

## 2021-03-10 NOTE — Telephone Encounter (Signed)
New Message:     Patient said he have been in and out of Afib since Monday. At this time, he is not in Afib. Patien said he will need a note stating that he is alright to go back to work. He said he will probably need to be seen before he can be cleared to go back to work..     Patient c/o Palpitations:  High priority if patient c/o lightheadedness, shortness of breath, or chest pain  How long have you had palpitations/irregular HR/ Afib? Are you having the symptoms now? Since Monday night, patient have been in and out of Afib. Patient is not in Afib at this time  Are you currently experiencing lightheadedness, SOB or CP? A little shortness of breath when it was happening, not any at this time  Do you have a history of afib (atrial fibrillation) or irregular heart rhythm? Yes he have a history of Afib  Have you checked your BP or HR? (document readings if available): great  Are you experiencing any other symptoms? No other symptoms

## 2021-03-10 NOTE — Telephone Encounter (Signed)
I spoke with patient. He has been in and out of afib since Monday.  Does not feel like he is in afib now.  He was unable to go to work the last few days due to afib.  Feels drained.

## 2021-03-10 NOTE — Telephone Encounter (Signed)
Afib clinic can see patient on 8/15.  I spoke with patient and scheduled appointment for 8/15 at 2:30 with Roderic Palau, NP

## 2021-03-15 ENCOUNTER — Other Ambulatory Visit: Payer: Self-pay

## 2021-03-15 ENCOUNTER — Encounter (HOSPITAL_COMMUNITY): Payer: Self-pay | Admitting: Nurse Practitioner

## 2021-03-15 ENCOUNTER — Encounter (HOSPITAL_COMMUNITY): Payer: Self-pay

## 2021-03-15 ENCOUNTER — Ambulatory Visit (HOSPITAL_COMMUNITY)
Admission: RE | Admit: 2021-03-15 | Discharge: 2021-03-15 | Disposition: A | Discharge status: Home / Self Care | Payer: 59 | Source: Ambulatory Visit | Attending: Nurse Practitioner | Admitting: Nurse Practitioner

## 2021-03-15 VITALS — BP 128/62 | HR 84 | Ht 73.0 in | Wt 314.8 lb

## 2021-03-15 DIAGNOSIS — D6869 Other thrombophilia: Secondary | ICD-10-CM

## 2021-03-15 DIAGNOSIS — R002 Palpitations: Secondary | ICD-10-CM | POA: Diagnosis not present

## 2021-03-15 DIAGNOSIS — I480 Paroxysmal atrial fibrillation: Secondary | ICD-10-CM

## 2021-03-15 DIAGNOSIS — Z79899 Other long term (current) drug therapy: Secondary | ICD-10-CM | POA: Diagnosis not present

## 2021-03-15 DIAGNOSIS — I4891 Unspecified atrial fibrillation: Secondary | ICD-10-CM | POA: Diagnosis not present

## 2021-03-15 LAB — COMPREHENSIVE METABOLIC PANEL
ALT: 15 U/L (ref 0–44)
AST: 21 U/L (ref 15–41)
Albumin: 3.5 g/dL (ref 3.5–5.0)
Alkaline Phosphatase: 124 U/L (ref 38–126)
Anion gap: 9 (ref 5–15)
BUN: 13 mg/dL (ref 6–20)
CO2: 29 mmol/L (ref 22–32)
Calcium: 9.1 mg/dL (ref 8.9–10.3)
Chloride: 98 mmol/L (ref 98–111)
Creatinine, Ser: 0.71 mg/dL (ref 0.61–1.24)
GFR, Estimated: >60 mL/min (ref >60)
Glucose, Bld: 220 mg/dL — ABNORMAL HIGH (ref 70–99)
Potassium: 4.3 mmol/L (ref 3.5–5.1)
Sodium: 136 mmol/L (ref 135–145)
Total Bilirubin: 0.4 mg/dL (ref 0.3–1.2)
Total Protein: 6.5 g/dL (ref 6.5–8.1)

## 2021-03-15 LAB — CBC
HCT: 42.2 % (ref 39.0–52.0)
Hemoglobin: 13.8 g/dL (ref 13.0–17.0)
MCH: 30.3 pg (ref 26.0–34.0)
MCHC: 32.7 g/dL (ref 30.0–36.0)
MCV: 92.7 fL (ref 80.0–100.0)
Platelets: 230 10*3/uL (ref 150–400)
RBC: 4.55 MIL/uL (ref 4.22–5.81)
RDW: 12.6 % (ref 11.5–15.5)
WBC: 7.7 10*3/uL (ref 4.0–10.5)
nRBC: 0 % (ref 0.0–0.2)

## 2021-03-15 LAB — TSH: TSH: 0.739 u[IU]/mL (ref 0.350–4.500)

## 2021-03-15 NOTE — Progress Notes (Addendum)
Primary Care Physician: Lennie Odor, PA Referring Physician: Dr. Nils Pyle is a 60 y.o. male with a h/o afib, s/p ablation in 2016. He is in the afib clinic for evaluation for intermittent  palpitations for 2 weeks. This felt like afib to the pt. He has not felt well for the last 2 weeks and has found it difficult for him to do his job. He has not checked his V/S to see if he is running fast. He has gained weight since his ablation, now at 327 lbs. He states that he does not use alcohol, no excessive caffeine, no tobacco. He does use CPAP. He is complaining  about being very cold, wrapped up in a blanket today, perspiring, but is afebrile.Denies recent illness.  F/u in afib clinic, 03/15/21. I have not seen x 4 yrs.  He is here as he had several days of afib in and out over the weekend. He works in the heat at [employer]. He is back in SR now. He feels that he may have gotten dehydrated. His afib has been very quiet until this past weekend. He was on  AAD in the past but stopped after ablation in 2016.   Today, he denies symptoms of palpitations, chest pain, shortness of breath, orthopnea, PND, lower extremity edema, dizziness, presyncope, syncope, or neurologic sequela. The patient is tolerating medications without difficulties and is otherwise without complaint today.   Past Medical History:  Diagnosis Date   Anxiety    Atrial fibrillation (HCC)    DDD (degenerative disc disease), cervical    Diabetes mellitus without complication ([place])    Hyperlipidemia    Kidney stone    Lumbar herniated disc    Obstructive sleep apnea    mild with AHI 10/hr now on CPAP   Overweight    Pneumonia    Sleep apnea    Past Surgical History:  Procedure Laterality Date   ANKLE SURGERY Right    ELECTROPHYSIOLOGIC STUDY N/A 12/25/2014   Procedure: Atrial Fibrillation Ablation;  Surgeon: Thompson Grayer, MD;  Location: [place] [facility];  Service: Cardiovascular;  Laterality: N/A;    MANDIBLE FRACTURE SURGERY     SHOULDER ARTHROSCOPY WITH OPEN ROTATOR CUFF REPAIR Bilateral    SHOULDER SURGERY     TEE WITHOUT CARDIOVERSION N/A 12/24/2014   Procedure: TRANSESOPHAGEAL ECHOCARDIOGRAM (TEE);  Surgeon: [name], MD;  Location: [facility] ENDOSCOPY;  Service: Cardiovascular;  Laterality: N/A;   WRIST SURGERY Right     Current Outpatient Medications  Medication Sig Dispense Refill   atorvastatin (LIPITOR) 10 MG tablet Take 1 tablet by mouth daily.     buPROPion (WELLBUTRIN XL) 150 MG 24 hr tablet Take 150 mg by mouth every morning.     busPIRone (BUSPAR) 10 MG tablet Take 1 tablet by mouth 3 (three) times daily.     Continuous Blood Gluc Sensor (DEXCOM G6 SENSOR) MISC SMARTSIG:1 Each Topical Every 10 Days     Continuous Blood Gluc Transmit (DEXCOM G6 TRANSMITTER) MISC      diltiazem (CARDIZEM CD) 240 MG 24 hr capsule Take 1 capsule (240 mg total) by mouth daily. 90 capsule 2   diltiazem (CARDIZEM) 60 MG tablet TAKE ONE TABLET BY MOUTH DAILY 90 tablet 1   escitalopram (LEXAPRO) 20 MG tablet Take 1 tablet by mouth daily.     furosemide (LASIX) 40 MG tablet TAKE ONE TABLET BY MOUTH DAILY AS NEEDED FOR FLUID 90 tablet 2   JARDIANCE 25 MG  TABS tablet Take 25 mg by mouth daily.     lisinopril (ZESTRIL) 10 MG tablet Take 10 mg by mouth daily.     metFORMIN (GLUCOPHAGE) 1000 MG tablet Take 1 tablet by mouth 2 (two) times daily.     RYBELSUS 3 MG TABS Take 1 tablet by mouth every morning.     No current facility-administered medications for this encounter.    No Known Allergies  Social History   Socioeconomic History   Marital status: Divorced    Spouse name: Not on file   Number of children: Not on file   Years of education: Not on file   Highest education level: Not on file  Occupational History   Not on file  Tobacco Use   Smoking status: Never   Smokeless tobacco: Never  Vaping Use   Vaping Use: Never used  Substance and Sexual Activity   Alcohol use: No   Drug  use: No   Sexual activity: Not on file  Other Topics Concern   Not on file  Social History Narrative   Pt lives in [place] with mother.  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 Strain: Not on [employer] Insecurity: Not on file  Transportation Needs: Not on file  Physical Activity: Not on file  Stress: Not on file  Social Connections: Not on file  Intimate Partner Violence: Not on file    Family History  Problem Relation Age of Onset   Heart failure Mother    Heart attack Mother 61       Widow maker [place]   Cancer Paternal Grandmother    Stroke Other     ROS- All systems are reviewed and negative except as per the HPI above  Physical Exam: Vitals:   03/15/21 1434  BP: 128/62  Pulse: 84  Weight: (!) 142.8 kg  Height: '6\' 1"'$  (1.854 m)   Wt Readings from Last 3 Encounters:  03/15/21 (!) 142.8 kg  12/09/19 (!) 145 kg  07/01/19 (!) 148.3 kg    Labs: Lab Results  Component Value Date   NA 134 (L) 04/24/2018   K 4.0 04/24/2018   CL 96 (L) 04/24/2018   CO2 25 04/24/2018   GLUCOSE 252 (H) 04/24/2018   BUN 9 04/24/2018   CREATININE 0.79 04/24/2018   CALCIUM 9.4 04/24/2018   Lab Results  Component Value Date   INR 1.5 (H) 01/07/2014   No results found for: CHOL, HDL, LDLCALC, TRIG   GEN- The patient is well appearing, alert and oriented x 3 today.   Head- normocephalic, atraumatic Eyes-  Sclera clear, conjunctiva pink Ears- hearing intact Oropharynx- clear Neck- supple, no JVP Lymph- no cervical lymphadenopathy Lungs- Clear to ausculation bilaterally, normal work of breathing Heart- Regular rate and rhythm, no murmurs, rubs or gallops, PMI not laterally displaced GI- soft, NT, ND, + BS Extremities- no clubbing, cyanosis, or edema MS- no significant deformity or atrophy Skin- no rash or lesion Psych- euthymic mood, full affect Neuro- strength and sensation are intact  EKG-  NSR at 84 bpm, pr int 172 ms, qrs int 94 ms, qtc 465 ms  Epic records reviewed   Assessment and Plan: 1.Palpitations Afib quiet until this past weekend  Intermittent over the weekend but is now back in SR No change in meds  Continue diltiazem 240 mg daily  He feels dehydration is to blame working in the heat  He usually tries to  drink ample fluids Will check CBC/bmet/ tsh today   I will write a note for him to return to work without restrictions today   2. CHA2DS2VASc  score of 1 Not on anticoagulation by guidelines    Will see as needed   Butch Penny C. Hans Rusher, [facility] [address] [place], [place] [address] [phone]

## 2021-03-29 ENCOUNTER — Other Ambulatory Visit: Payer: Self-pay | Admitting: Interventional Cardiology

## 2021-04-07 ENCOUNTER — Ambulatory Visit (HOSPITAL_COMMUNITY)
Admission: RE | Admit: 2021-04-07 | Discharge: 2021-04-07 | Disposition: A | Discharge status: Home / Self Care | Payer: 59 | Source: Ambulatory Visit | Attending: Nurse Practitioner | Admitting: Nurse Practitioner

## 2021-04-07 ENCOUNTER — Other Ambulatory Visit: Payer: Self-pay

## 2021-04-07 VITALS — BP 116/68 | HR 80 | Ht 73.0 in | Wt 309.4 lb

## 2021-04-07 DIAGNOSIS — R0602 Shortness of breath: Secondary | ICD-10-CM | POA: Insufficient documentation

## 2021-04-07 DIAGNOSIS — G4733 Obstructive sleep apnea (adult) (pediatric): Secondary | ICD-10-CM | POA: Diagnosis not present

## 2021-04-07 DIAGNOSIS — R002 Palpitations: Secondary | ICD-10-CM | POA: Diagnosis present

## 2021-04-07 DIAGNOSIS — R0600 Dyspnea, unspecified: Secondary | ICD-10-CM

## 2021-04-07 DIAGNOSIS — R0609 Other forms of dyspnea: Secondary | ICD-10-CM

## 2021-04-07 DIAGNOSIS — I480 Paroxysmal atrial fibrillation: Secondary | ICD-10-CM | POA: Diagnosis not present

## 2021-04-07 DIAGNOSIS — Z8249 Family history of ischemic heart disease and other diseases of the circulatory system: Secondary | ICD-10-CM | POA: Insufficient documentation

## 2021-04-07 DIAGNOSIS — R5383 Other fatigue: Secondary | ICD-10-CM | POA: Insufficient documentation

## 2021-04-07 MED ORDER — DILTIAZEM HCL 60 MG PO TABS: 60.0000 mg | ORAL_TABLET | ORAL | Status: DC | PRN

## 2021-04-07 NOTE — Patient Instructions (Signed)
Scheduling for stress test and echo - will contact you after approval from insurance.

## 2021-04-07 NOTE — Progress Notes (Signed)
Primary Care Physician: Lennie Odor, PA Referring Physician: Dr. Rayann Heman Cardiologist: Dr. Nicanor Kidd is a 60 y.o. male with a h/o afib, s/p ablation in 2016. He is in the afib clinic, 9/24/19for evaluation for intermittent  palpitations for 2 weeks. This felt like afib to the pt. He has not felt well for the last 2 weeks and has found it difficult for him to do his job. He has not checked his V/S to see if he is running fast. He has gained weight since his ablation, now at 327 lbs. He states that he does not use alcohol, no excessive caffeine, no tobacco. He does use CPAP. He is complaining  about being very cold, wrapped up in a blanket today, perspiring, but is afebrile.Denies recent illness.  F/u in afib clinic, 03/15/21. I have not seen x 3 yrs.  He is here as he had several days of afib in and out over the weekend. He works in the heat at [employer]. He is back in SR now. He feels that he may have gotten dehydrated. His afib has been very quiet until this past weekend. He was on  AAD in the past but stopped after ablation in 2016.   F/u in afib clinic 04/07/21. Pt called and asked to be seen is as he felt he could not do his job for a few days this week and wants a note to return to work. Some heart irregularity noted.  He states that he cannot tolerate the heat and finds it  hard to stay hydrated although it sounds as thought he drinks a lot of water and Gatorade. He states st work he finds it difficult to have the energy to do his work. He feels it is hard to breathe when it is hot. He is in SR today.  Has OSA states he uses CPAP. Labs drawn in August were normal.  Today, he denies symptoms of palpitations, chest pain, shortness of breath, orthopnea, PND, lower extremity edema, dizziness, presyncope, syncope, or neurologic sequela. The patient is tolerating medications without difficulties and is otherwise without complaint today.   Past Medical History:  Diagnosis  Date   Anxiety    Atrial fibrillation (HCC)    DDD (degenerative disc disease), cervical    Diabetes mellitus without complication ([place])    Hyperlipidemia    Kidney stone    Lumbar herniated disc    Obstructive sleep apnea    mild with AHI 10/hr now on CPAP   Overweight    Pneumonia    Sleep apnea    Past Surgical History:  Procedure Laterality Date   ANKLE SURGERY Right    ELECTROPHYSIOLOGIC STUDY N/A 12/25/2014   Procedure: Atrial Fibrillation Ablation;  Surgeon: Thompson Grayer, MD;  Location: [place] [facility];  Service: Cardiovascular;  Laterality: N/A;   MANDIBLE FRACTURE SURGERY     SHOULDER ARTHROSCOPY WITH OPEN ROTATOR CUFF REPAIR Bilateral    SHOULDER SURGERY     TEE WITHOUT CARDIOVERSION N/A 12/24/2014   Procedure: TRANSESOPHAGEAL ECHOCARDIOGRAM (TEE);  Surgeon: [name], MD;  Location: [facility] ENDOSCOPY;  Service: Cardiovascular;  Laterality: N/A;   WRIST SURGERY Right     Current Outpatient Medications  Medication Sig Dispense Refill   atorvastatin (LIPITOR) 10 MG tablet Take 1 tablet by mouth daily.     buPROPion (WELLBUTRIN XL) 150 MG 24 hr tablet Take 150 mg by mouth every morning.     busPIRone (BUSPAR) 10 MG tablet Take  1 tablet by mouth 3 (three) times daily.     Continuous Blood Gluc Receiver (DEXCOM G6 RECEIVER) DEVI      Continuous Blood Gluc Sensor (DEXCOM G6 SENSOR) MISC SMARTSIG:1 Each Topical Every 10 Days     Continuous Blood Gluc Transmit (DEXCOM G6 TRANSMITTER) MISC      diltiazem (CARDIZEM CD) 240 MG 24 hr capsule TAKE ONE CAPSULE BY MOUTH DAILY 90 capsule 2   escitalopram (LEXAPRO) 20 MG tablet Take 1 tablet by mouth daily.     furosemide (LASIX) 40 MG tablet TAKE ONE TABLET BY MOUTH DAILY AS NEEDED FOR FLUID (Patient taking differently: as needed.) 90 tablet 2   JARDIANCE 25 MG TABS tablet Take 25 mg by mouth daily.     lisinopril (ZESTRIL) 10 MG tablet Take 10 mg by mouth daily.     metFORMIN (GLUCOPHAGE) 1000 MG tablet Take 1 tablet by mouth 2  (two) times daily.     RYBELSUS 3 MG TABS Take 1 tablet by mouth every morning.     diltiazem (CARDIZEM) 60 MG tablet Take 1 tablet (60 mg total) by mouth as needed.     No current facility-administered medications for this encounter.    No Known Allergies  Social History   Socioeconomic History   Marital status: Divorced    Spouse name: Not on file   Number of children: Not on file   Years of education: Not on file   Highest education level: Not on file  Occupational History   Not on file  Tobacco Use   Smoking status: Never   Smokeless tobacco: Never  Vaping Use   Vaping Use: Never used  Substance and Sexual Activity   Alcohol use: No   Drug use: No   Sexual activity: Not on file  Other Topics Concern   Not on file  Social History Narrative   Pt lives in [place] with mother.  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 Strain: Not on [employer] Insecurity: Not on file  Transportation Needs: Not on file  Physical Activity: Not on file  Stress: Not on file  Social Connections: Not on file  Intimate Partner Violence: Not on file    Family History  Problem Relation Age of Onset   Heart failure Mother    Heart attack Mother 21       Widow maker MI   Cancer Paternal Grandmother    Stroke Other     ROS- All systems are reviewed and negative except as per the HPI above  Physical Exam: Vitals:   04/07/21 0932  BP: 116/68  Pulse: 80  Weight: (!) 140.3 kg  Height: '6\' 1"'$  (1.854 m)   Wt Readings from Last 3 Encounters:  04/07/21 (!) 140.3 kg  03/15/21 (!) 142.8 kg  12/09/19 (!) 145 kg    Labs: Lab Results  Component Value Date   NA 136 03/15/2021   K 4.3 03/15/2021   CL 98 03/15/2021   CO2 29 03/15/2021   GLUCOSE 220 (H) 03/15/2021   BUN 13 03/15/2021   CREATININE 0.71 03/15/2021   CALCIUM 9.1 03/15/2021   Lab Results  Component Value Date   INR 1.5 (H) 01/07/2014    No results found for: CHOL, HDL, LDLCALC, TRIG   GEN- The patient is well appearing, alert and oriented x 3 today.   Head- normocephalic, atraumatic Eyes-  Sclera clear, conjunctiva pink  Ears- hearing intact Oropharynx- clear Neck- supple, no JVP Lymph- no cervical lymphadenopathy Lungs- Clear to ausculation bilaterally, normal work of breathing Heart- Regular rate and rhythm, no murmurs, rubs or gallops, PMI not laterally displaced GI- soft, NT, ND, + BS Extremities- no clubbing, cyanosis, or edema MS- no significant deformity or atrophy Skin- no rash or lesion Psych- euthymic mood, full affect Neuro- strength and sensation are intact  EKG- NSR at 80 bpm, pr int 168 ms, qrs int 90 ms, qtc 445 ms  Epic records reviewed   Assessment and Plan: 1.Palpitations  Pt states he is feeling intermittent palpitations at work When I have seen hinm twice recently he is back in rhythm He is missing some days at work and when I see him he asks for a note to return to work  No change in meds today  Continue diltiazem 240 mg daily  He feels dehydration is to blame working in the heat, hopefully now going into cooler weather this should improve  He usually tries to  drink ample fluids in the heat  I will write a note for him to return to work without restrictions today  1 week zio patch for palpitations   2. CHA2DS2VASc  score of 1 Not on anticoagulation by guidelines   3. C/o fatigue with work and some shortness of breath Risk factors of DM, obesity,  middle age  Geraldo Docker and update echo   I will order tests and forward to Dr. Irish Lack  F/u with PCP next week as scheduled   Butch Penny C. Walterine Amodei, [facility] [address] [place],  [address] [phone]

## 2021-04-08 ENCOUNTER — Other Ambulatory Visit (HOSPITAL_COMMUNITY): Payer: Self-pay | Admitting: *Deleted

## 2021-04-08 DIAGNOSIS — I480 Paroxysmal atrial fibrillation: Secondary | ICD-10-CM

## 2021-04-13 ENCOUNTER — Other Ambulatory Visit (HOSPITAL_COMMUNITY): Payer: Self-pay | Admitting: Nurse Practitioner

## 2021-04-13 DIAGNOSIS — R0609 Other forms of dyspnea: Secondary | ICD-10-CM

## 2021-04-13 DIAGNOSIS — R0600 Dyspnea, unspecified: Secondary | ICD-10-CM

## 2021-05-03 ENCOUNTER — Telehealth (HOSPITAL_COMMUNITY): Payer: Self-pay

## 2021-05-03 NOTE — Telephone Encounter (Signed)
Attempted to contact the patient, there was no answer or voicemail. Will try again later. S.Baya Lentz EMTP

## 2021-05-04 ENCOUNTER — Ambulatory Visit (HOSPITAL_COMMUNITY): Payer: 59 | Attending: Cardiology

## 2021-05-04 ENCOUNTER — Ambulatory Visit (HOSPITAL_COMMUNITY): Payer: 59

## 2021-05-05 ENCOUNTER — Encounter (HOSPITAL_COMMUNITY): Payer: Self-pay | Admitting: Nurse Practitioner

## 2021-05-05 ENCOUNTER — Ambulatory Visit (HOSPITAL_COMMUNITY): Payer: 59

## 2021-05-13 ENCOUNTER — Telehealth (HOSPITAL_COMMUNITY): Payer: Self-pay | Admitting: Nurse Practitioner

## 2021-05-13 NOTE — Telephone Encounter (Signed)
Just an FYI. We have made several attempts to contact this patient including sending a letter to schedule or reschedule their echocardiogram and Myoview . We will be removing the patient from the echo/nuc  WQ.   05/05/21 Patient NO SHOWED-MAILED LETTER       Thank you

## 2021-07-26 ENCOUNTER — Other Ambulatory Visit: Payer: Self-pay | Admitting: Interventional Cardiology

## 2021-08-10 NOTE — Progress Notes (Deleted)
Cardiology Office Note   Date:  08/10/2021   ID:  Justin Kidd, DOB 03-24-1961, MRN [id number]  PCP:  Lennie Odor, PA    No chief complaint on file.  Chronic diastolic heart failure/AFib  Wt Readings from Last 3 Encounters:  04/07/21 (!) 309 lb 6.4 oz (140.3 kg)  03/15/21 (!) 314 lb 12.8 oz (142.8 kg)  12/09/19 (!) 319 lb 9.6 oz (145 kg)       History of Present Illness: Justin Kidd is a 61 y.o. male  with a h/o PAF since age 21. He failed AAD (Flecainide) and had an ablationin 2016.  This has been successful.  He is using CPAP for OSA.  Xarelto was stopped in 2016.   He has had back problems in the past requiring steroids and possibly surgery.  He has declined back surgery.    He had some fatigue in 9/19.  He had a Zio patch and echo.  He felt he could not do his job at that time.    Zio patch showed: Sinus rhythm No atrial fibrillation Long RP tachycardia is noted 04/29/18 at 10:18 pm and likely represents sinus rhythm No AV block or pauses   Echo in 10/19 showed: Left ventricle: The cavity size was normal. There was moderate  focal basal hypertrophy. Systolic function was vigorous. The  estimated ejection fraction was in the range of 65% to 70%. Wall  motion was normal; there were no regional wall motion  abnormalities. Left ventricular diastolic function parameters  were normal.   He lost weight in early 2020 with lifestyle changes including stopping soda consumption.   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        Past Medical History:  Diagnosis Date   Anxiety    Atrial fibrillation (HCC)    DDD (degenerative disc disease), cervical    Diabetes mellitus without complication ([place])    Hyperlipidemia    Kidney stone    Lumbar herniated disc    Obstructive sleep apnea    mild with AHI 10/hr now on CPAP   Overweight    Pneumonia    Sleep apnea     Past Surgical History:  Procedure Laterality  Date   ANKLE SURGERY Right    ELECTROPHYSIOLOGIC STUDY N/A 12/25/2014   Procedure: Atrial Fibrillation Ablation;  Surgeon: Thompson Grayer, MD;  Location: [place] [facility];  Service: Cardiovascular;  Laterality: N/A;   MANDIBLE FRACTURE SURGERY     SHOULDER ARTHROSCOPY WITH OPEN ROTATOR CUFF REPAIR Bilateral    SHOULDER SURGERY     TEE WITHOUT CARDIOVERSION N/A 12/24/2014   Procedure: TRANSESOPHAGEAL ECHOCARDIOGRAM (TEE);  Surgeon: [name], MD;  Location: [facility] ENDOSCOPY;  Service: Cardiovascular;  Laterality: N/A;   WRIST SURGERY Right      Current Outpatient Medications  Medication Sig Dispense Refill   atorvastatin (LIPITOR) 10 MG tablet Take 1 tablet by mouth daily.     buPROPion (WELLBUTRIN XL) 150 MG 24 hr tablet Take 150 mg by mouth every morning.     busPIRone (BUSPAR) 10 MG tablet Take 1 tablet by mouth 3 (three) times daily.     Continuous Blood Gluc Receiver (DEXCOM G6 RECEIVER) DEVI      Continuous Blood Gluc Sensor (DEXCOM G6 SENSOR) MISC SMARTSIG:1 Each Topical Every 10 Days     Continuous Blood Gluc Transmit (DEXCOM G6 TRANSMITTER) MISC      diltiazem (CARDIZEM CD) 240 MG  24 hr capsule TAKE ONE CAPSULE BY MOUTH DAILY 90 capsule 2   diltiazem (CARDIZEM) 60 MG tablet TAKE ONE TABLET BY MOUTH DAILY 90 tablet 0   escitalopram (LEXAPRO) 20 MG tablet Take 1 tablet by mouth daily.     furosemide (LASIX) 40 MG tablet TAKE ONE TABLET BY MOUTH DAILY AS NEEDED FOR FLUID 90 tablet 0   JARDIANCE 25 MG TABS tablet Take 25 mg by mouth daily.     lisinopril (ZESTRIL) 10 MG tablet Take 10 mg by mouth daily.     metFORMIN (GLUCOPHAGE) 1000 MG tablet Take 1 tablet by mouth 2 (two) times daily.     RYBELSUS 3 MG TABS Take 1 tablet by mouth every morning.     No current facility-administered medications for this visit.    Allergies:   Patient has no known allergies.    Social History:  The patient  reports that he has never smoked. He has never used smokeless tobacco. He reports  that he does not drink alcohol and does not use drugs.   Family History:  The patient's ***family history includes Cancer in his paternal grandmother; Heart attack (age of onset: 23) in his mother; Heart failure in his mother; Stroke in an other family member.    ROS:  Please see the history of present illness.   Otherwise, review of systems are positive for ***.   All other systems are reviewed and negative.    PHYSICAL EXAM: VS:  There were no vitals taken for this visit. , BMI There is no height or weight on file to calculate BMI. GEN: Well nourished, well developed, in no acute distress HEENT: normal Neck: no JVD, carotid bruits, or masses Cardiac: ***RRR; no murmurs, rubs, or gallops,no edema  Respiratory:  clear to auscultation bilaterally, normal work of breathing GI: soft, nontender, nondistended, + BS MS: no deformity or atrophy Skin: warm and dry, no rash Neuro:  Strength and sensation are intact Psych: euthymic mood, full affect   EKG:   The ekg ordered today demonstrates ***   Recent Labs: 03/15/2021: ALT 15; BUN 13; Creatinine, Ser 0.71; Hemoglobin 13.8; Platelets 230; Potassium 4.3; Sodium 136; TSH 0.739   Lipid Panel No results found for: CHOL, TRIG, HDL, CHOLHDL, VLDL, LDLCALC, LDLDIRECT   Other studies Reviewed: Additional studies/ records that were reviewed today with results demonstrating: ***.   ASSESSMENT AND PLAN:  AFib:  Obesity: Chronic diastolic heart failure: OSA: Elevated blood sugar:   Current medicines are reviewed at length with the patient today.  The patient concerns regarding his medicines were addressed.  The following changes have been made:  No change***  Labs/ tests ordered today include: *** No orders of the defined types were placed in this encounter.   Recommend 150 minutes/week of aerobic exercise Low fat, low carb, high fiber diet recommended  Disposition:   FU in ***   Signed, Larae Grooms, MD  08/10/2021 10:09  AM    [facility] [place], [place], [place]  [address] Phone: [phone]; Fax: [phone]

## 2021-08-13 ENCOUNTER — Ambulatory Visit: Payer: Self-pay | Admitting: Interventional Cardiology

## 2021-08-13 DIAGNOSIS — I5032 Chronic diastolic (congestive) heart failure: Secondary | ICD-10-CM

## 2021-08-13 DIAGNOSIS — D6869 Other thrombophilia: Secondary | ICD-10-CM

## 2021-08-13 DIAGNOSIS — G4733 Obstructive sleep apnea (adult) (pediatric): Secondary | ICD-10-CM

## 2021-08-13 DIAGNOSIS — I480 Paroxysmal atrial fibrillation: Secondary | ICD-10-CM

## 2022-02-21 ENCOUNTER — Other Ambulatory Visit: Payer: Self-pay | Admitting: Physician Assistant

## 2022-02-21 DIAGNOSIS — R1310 Dysphagia, unspecified: Secondary | ICD-10-CM

## 2022-02-24 ENCOUNTER — Ambulatory Visit
Admission: RE | Admit: 2022-02-24 | Discharge: 2022-02-24 | Disposition: A | Discharge status: Home / Self Care | Payer: Commercial Managed Care - HMO | Source: Ambulatory Visit | Attending: Physician Assistant | Admitting: Physician Assistant

## 2022-02-24 DIAGNOSIS — R1310 Dysphagia, unspecified: Secondary | ICD-10-CM

## 2022-09-08 ENCOUNTER — Encounter (HOSPITAL_COMMUNITY): Payer: Self-pay | Admitting: *Deleted
